# Patient Record
Sex: Male | Born: 1955 | Race: White | Hispanic: No | Smoking: Former smoker
Health system: Southern US, Community
[De-identification: ages and names within clinical notes are randomized; demographics above are authoritative.]

## PROBLEM LIST (undated history)

## (undated) DIAGNOSIS — E785 Hyperlipidemia, unspecified: Secondary | ICD-10-CM

## (undated) DIAGNOSIS — I10 Essential (primary) hypertension: Secondary | ICD-10-CM

## (undated) HISTORY — DX: Essential (primary) hypertension: I10

## (undated) HISTORY — DX: Hyperlipidemia, unspecified: E78.5

## (undated) HISTORY — PX: TONSILLECTOMY: SUR1361

## (undated) HISTORY — PX: CATARACT EXTRACTION: SUR2

## (undated) HISTORY — PX: HYDROCELE EXCISION / REPAIR: SUR1145

---

## 2010-04-30 ENCOUNTER — Inpatient Hospital Stay (INDEPENDENT_AMBULATORY_CARE_PROVIDER_SITE_OTHER)
Admission: RE | Admit: 2010-04-30 | Discharge: 2010-04-30 | Disposition: A | Discharge status: Home / Self Care | Payer: Self-pay | Source: Ambulatory Visit | Attending: Family Medicine | Admitting: Family Medicine

## 2010-04-30 DIAGNOSIS — D1779 Benign lipomatous neoplasm of other sites: Secondary | ICD-10-CM

## 2010-06-16 ENCOUNTER — Encounter (INDEPENDENT_AMBULATORY_CARE_PROVIDER_SITE_OTHER): Payer: Self-pay | Admitting: *Deleted

## 2010-06-16 LAB — CONVERTED CEMR LAB
CO2: 28 meq/L (ref 19–32)
Cholesterol: 263 mg/dL — ABNORMAL HIGH (ref 0–200)
Creatinine, Ser: 1.05 mg/dL (ref 0.40–1.50)
Glucose, Bld: 112 mg/dL — ABNORMAL HIGH (ref 70–99)
HDL: 41 mg/dL (ref >39)
Total Bilirubin: 0.5 mg/dL (ref 0.3–1.2)
Total CHOL/HDL Ratio: 6.4
Triglycerides: 158 mg/dL — ABNORMAL HIGH (ref <150)
VLDL: 32 mg/dL (ref 0–40)

## 2010-07-01 ENCOUNTER — Ambulatory Visit (HOSPITAL_COMMUNITY)
Admission: RE | Admit: 2010-07-01 | Discharge: 2010-07-01 | Disposition: A | Discharge status: Home / Self Care | Payer: Self-pay | Source: Ambulatory Visit | Attending: Surgery | Admitting: Surgery

## 2010-07-01 ENCOUNTER — Other Ambulatory Visit (HOSPITAL_COMMUNITY): Payer: Self-pay | Admitting: Surgery

## 2010-07-01 ENCOUNTER — Encounter (HOSPITAL_COMMUNITY)
Admission: RE | Admit: 2010-07-01 | Discharge: 2010-07-01 | Disposition: A | Discharge status: Home / Self Care | Payer: Self-pay | Source: Ambulatory Visit | Attending: Surgery | Admitting: Surgery

## 2010-07-01 DIAGNOSIS — Z01810 Encounter for preprocedural cardiovascular examination: Secondary | ICD-10-CM | POA: Insufficient documentation

## 2010-07-01 DIAGNOSIS — D1779 Benign lipomatous neoplasm of other sites: Secondary | ICD-10-CM | POA: Insufficient documentation

## 2010-07-01 DIAGNOSIS — Z01811 Encounter for preprocedural respiratory examination: Secondary | ICD-10-CM | POA: Insufficient documentation

## 2010-07-01 DIAGNOSIS — Z01818 Encounter for other preprocedural examination: Secondary | ICD-10-CM | POA: Insufficient documentation

## 2010-07-01 DIAGNOSIS — D170 Benign lipomatous neoplasm of skin and subcutaneous tissue of head, face and neck: Secondary | ICD-10-CM

## 2010-07-01 LAB — CBC
HCT: 46.1 % (ref 39.0–52.0)
MCHC: 35.1 g/dL (ref 30.0–36.0)
RDW: 12.6 % (ref 11.5–15.5)

## 2010-07-01 LAB — SURGICAL PCR SCREEN: MRSA, PCR: NEGATIVE

## 2010-07-01 LAB — BASIC METABOLIC PANEL
BUN: 16 mg/dL (ref 6–23)
Calcium: 9.7 mg/dL (ref 8.4–10.5)
GFR calc non Af Amer: >60 mL/min (ref >60)
Glucose, Bld: 110 mg/dL — ABNORMAL HIGH (ref 70–99)

## 2010-07-10 ENCOUNTER — Ambulatory Visit (HOSPITAL_COMMUNITY)
Admission: RE | Admit: 2010-07-10 | Discharge: 2010-07-10 | Disposition: A | Discharge status: Home / Self Care | Payer: Self-pay | Source: Ambulatory Visit | Attending: Surgery | Admitting: Surgery

## 2010-07-10 ENCOUNTER — Other Ambulatory Visit: Payer: Self-pay | Admitting: Surgery

## 2010-07-10 DIAGNOSIS — L723 Sebaceous cyst: Secondary | ICD-10-CM | POA: Insufficient documentation

## 2010-07-10 DIAGNOSIS — F17200 Nicotine dependence, unspecified, uncomplicated: Secondary | ICD-10-CM | POA: Insufficient documentation

## 2010-07-10 DIAGNOSIS — D1739 Benign lipomatous neoplasm of skin and subcutaneous tissue of other sites: Secondary | ICD-10-CM | POA: Insufficient documentation

## 2010-07-10 DIAGNOSIS — I10 Essential (primary) hypertension: Secondary | ICD-10-CM | POA: Insufficient documentation

## 2010-07-11 NOTE — Op Note (Signed)
  NAME:  Andrew [name], Andrew [name] NO.:  [id number]  MEDICAL RECORD NO.:  [id number]           PATIENT TYPE:  O  LOCATION:  XRAY                         FACILITY:  [facility]  PHYSICIAN:  Sandria Bales. Ezzard Standing, M.D.  DATE OF BIRTH:  1955-07-05  DATE OF PROCEDURE:  07/01/2010                              OPERATIVE REPORT   PREOPERATIVE DIAGNOSIS:  Mass posterior neck 4 x 7 cm.  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  PROCEDURES:  Excision of lipoma and excision of sebaceous cyst.  SURGEON:  Sandria Bales. Ezzard Standing, MD.  ASSISTANT:  None.  ANESTHESIA:  General endotracheal in the prone position with 18 mL of 0.25% Marcaine.  COMPLICATIONS:  None.  PROCEDURE:  [name] is a 55 year old white male, who has no primary medical doctor, and who has had a mass on his posterior neck which he has noticed for at least several months if not longer.  It has steadily gotten larger and is causing discomfort and he now comes for excision of this mass.  The patient has no primary care physician.  The risks of surgery include bleeding, infection, nerve injury, and recurrence of the mass which appears to be lipoma.  OPERATIVE NOTE: The patient was placed in a prone position.  His neck was prepped with ChloraPrep, sterilely draped.    A time-out was held and surgical checklist run.  He has a mass which is 4 x 7 cm at the base of his hairline in his posterior neck.  I made a linear incision through the skin, excised this lipoma. The mass was sent to Pathology.  Hemostasis controlled with Bovie electrocautery.  The mass came out as a multilobulated block, but it was not septated.  To the left of the lipoma was a 1-cm sebaceous cyst.   I went excised this cyst through a separate incision.  I irrigated the wounds with 200 mL of saline.  I then closed the subcutaneous tissues with 3-0 Vicryl suture, the skin with a 5-0  Vicryl suture.  I did place two 2-0 nylon sutures in the wound that will have to come out just in case he had some swelling or tension of the suture line at the back of his neck.  The patient tolerated the procedure well.  He was transported to recovery room in good condition.  We discharged home today.  Return to see me in 10-14 days.   Sandria Bales. Ezzard Standing, M.D., FACS   DHN/MEDQ  D:  07/10/2010  T:  07/10/2010  Job:  [id number]  Electronically Signed by Ovidio Kin M.D. on 07/11/2010 11:57:00 AM

## 2016-07-22 ENCOUNTER — Emergency Department (HOSPITAL_COMMUNITY)
Admission: EM | Admit: 2016-07-22 | Discharge: 2016-07-22 | Disposition: A | Discharge status: Home / Self Care | Payer: Worker's Compensation | Attending: Emergency Medicine | Admitting: Emergency Medicine

## 2016-07-22 ENCOUNTER — Emergency Department (HOSPITAL_COMMUNITY): Payer: Worker's Compensation

## 2016-07-22 ENCOUNTER — Encounter (HOSPITAL_COMMUNITY): Payer: Self-pay | Admitting: *Deleted

## 2016-07-22 DIAGNOSIS — S61211A Laceration without foreign body of left index finger without damage to nail, initial encounter: Secondary | ICD-10-CM | POA: Insufficient documentation

## 2016-07-22 DIAGNOSIS — W268XXA Contact with other sharp object(s), not elsewhere classified, initial encounter: Secondary | ICD-10-CM | POA: Insufficient documentation

## 2016-07-22 DIAGNOSIS — Z23 Encounter for immunization: Secondary | ICD-10-CM | POA: Diagnosis not present

## 2016-07-22 DIAGNOSIS — F17200 Nicotine dependence, unspecified, uncomplicated: Secondary | ICD-10-CM | POA: Insufficient documentation

## 2016-07-22 DIAGNOSIS — Y929 Unspecified place or not applicable: Secondary | ICD-10-CM | POA: Diagnosis not present

## 2016-07-22 DIAGNOSIS — Y999 Unspecified external cause status: Secondary | ICD-10-CM | POA: Diagnosis not present

## 2016-07-22 DIAGNOSIS — Y939 Activity, unspecified: Secondary | ICD-10-CM | POA: Diagnosis not present

## 2016-07-22 MED ORDER — CEPHALEXIN 500 MG PO CAPS: 500.0000 mg | ORAL_CAPSULE | Freq: Four times a day (QID) | ORAL | 0 refills | Status: DC

## 2016-07-22 MED ORDER — LIDOCAINE HCL (PF) 1 % IJ SOLN
30.0000 mL | Freq: Once | INTRAMUSCULAR | Status: AC
  Administered 2016-07-22: 10 mL via INTRADERMAL

## 2016-07-22 MED ORDER — TETANUS-DIPHTH-ACELL PERTUSSIS 5-2.5-18.5 LF-MCG/0.5 IM SUSP
0.5000 mL | Freq: Once | INTRAMUSCULAR | Status: AC
  Administered 2016-07-22: 0.5 mL via INTRAMUSCULAR
  Filled 2016-07-22: qty 0.5

## 2016-07-22 MED ORDER — LIDOCAINE HCL (PF) 1 % IJ SOLN
INTRAMUSCULAR | Status: AC
  Administered 2016-07-22: 11:00:00
  Filled 2016-07-22: qty 10

## 2016-07-22 NOTE — ED Notes (Signed)
Patient transported to X-ray 

## 2016-07-22 NOTE — Discharge Instructions (Signed)
You have 11 stitches in your finger. Make sure you apply the antibiotic ointment. Take antibiotics as prescribed. Wound recheck in 3-4 days. Need to have the stitches out in 7-10 days. Keep the splint applied to decrease the mobility so you don't pull the stiches out. Return to the ED sooner if he develops any signs of infection. Make sure you have close follow-up.

## 2016-07-22 NOTE — ED Triage Notes (Signed)
Pt was grinding on a drum and the blade flew apart causing injury to left index finger- ( 1.5 in lac, bleeding controlled.  Able to move finger.  Avulsion to the left middle finger,  Last tetanus unknown.

## 2016-07-22 NOTE — ED Notes (Signed)
Called ortho tech for static finger splint.

## 2016-07-22 NOTE — ED Notes (Signed)
Pt needs UDS for WC. Lab notified.

## 2016-07-22 NOTE — ED Notes (Signed)
To x-ray

## 2016-07-22 NOTE — ED Provider Notes (Signed)
[facility] DEPT Provider Note   CSN: [id number] Arrival date & time: 07/22/16  6269  By signing my name below, I, Levester Fresh, attest that this documentation has been prepared under the direction and in the presence of Doristine Devoid, PA-C.  Electronically Signed: Levester Fresh, Scribe. 07/22/2016. 11:36 AM.  History   Chief Complaint Chief Complaint  Patient presents with  . Finger Injury   Jakari Sada Boyko is a 61 y.o. male who presents to the ED with complaints of left finger pain s/p a laceration injury. Pt was grinding on a drum when the blade became dislodged. Left 2nd digit with 1.5 inch lac and 3rd digit with abrasion. Bleeding controlled. Pt can bend finger and reports that sensation is intact. Unknown last tetanus.  Pt denies experiencing any other acute sx, including fever, nausea, vomiting, chills, numbness or weakness.   The history is provided by the patient. No language interpreter was used.    History reviewed. No pertinent past medical history.  There are no active problems to display for this patient.   History reviewed. No pertinent surgical history.     Home Medications    Prior to Admission medications   Not on File    Family History No family history on file.  Social History Social History  Substance Use Topics  . Smoking status: Current Some Day Smoker  . Smokeless tobacco: Never Used  . Alcohol use Yes     Comment: occ     Allergies   Patient has no known allergies.   Review of Systems Review of Systems  Constitutional: Negative for fever.  Gastrointestinal: Negative for nausea and vomiting.  Musculoskeletal:       Finger pain  Skin: Positive for wound.  Neurological: Negative for weakness and numbness.     Physical Exam Updated Vital Signs BP (!) 161/93 (BP Location: Right Arm)   Pulse 68   Temp 97.5 F (36.4 C) (Oral)   Resp 16   SpO2 95%   Physical Exam  Constitutional: He is oriented to person,  place, and time. He appears well-developed and well-nourished. No distress.  HENT:  Head: Normocephalic and atraumatic.  Cardiovascular: Normal rate and intact distal pulses.   Pulmonary/Chest: Effort normal.  Musculoskeletal: Normal range of motion.  1.5 in laceration to left index finger with skin flap. Wound was explored with no joint capsule involvement. Abrasion to middle finger. Bleeding controlled. Nailbed intact on index and middle finger. Sensation intact beyond injury to sharp/dull. Full range of motion of the DIP and PIP along with MC joint of the index and middle finger of the left hand. Cap refill is normal. Strength the DIP and PIP is 5 out of 5.  Neurological: He is alert and oriented to person, place, and time.  Skin: Skin is warm and dry. Capillary refill takes less than 2 seconds.  Psychiatric: He has a normal mood and affect.  Nursing note and vitals reviewed.       ED Treatments / Results  DIAGNOSTIC STUDIES: Oxygen Saturation is 95% on RA, adequate by my interpretation.    COORDINATION OF CARE: 10:22 AM Discussed treatment plan with pt at bedside and pt agreed to plan. Will consult hand.   Labs (all labs ordered are listed, but only abnormal results are displayed) Labs Reviewed - No data to display  EKG  EKG Interpretation None       Radiology Dg Finger Index Left  Result Date: 07/22/2016 CLINICAL DATA:  Left  index finger injury.  Laceration. EXAM: LEFT INDEX FINGER 2+V COMPARISON:  None. FINDINGS: No acute bony abnormality. Specifically, no fracture, subluxation, or dislocation. Soft tissues are intact. No radiopaque foreign bodies. IMPRESSION: No acute bony abnormality. Electronically Signed   By: Rolm Baptise M.D.   On: 07/22/2016 10:19    Procedures .Marland KitchenLaceration Repair Date/Time: 07/22/2016 11:37 AM Performed by: Doristine Devoid Authorized by: Ocie Cornfield T   Consent:    Consent obtained:  Verbal   Consent given by:  Patient   Risks  discussed:  Infection, pain, retained foreign body, tendon damage, poor cosmetic result, need for additional repair, nerve damage, poor wound healing and vascular damage   Alternatives discussed:  No treatment Anesthesia (see MAR for exact dosages):    Anesthesia method:  Nerve block   Block needle gauge:  27 G   Block anesthetic:  Lidocaine 1% w/o epi   Block injection procedure:  Anatomic landmarks identified, anatomic landmarks palpated, introduced needle, negative aspiration for blood and incremental injection   Block outcome:  Anesthesia achieved Laceration details:    Location:  Finger   Finger location:  L index finger   Length (cm):  1.5   Depth (mm):  5 Repair type:    Repair type:  Simple Pre-procedure details:    Preparation:  Patient was prepped and draped in usual sterile fashion and imaging obtained to evaluate for foreign bodies (no bony involvment or foreign body) Exploration:    Hemostasis achieved with:  Direct pressure   Wound exploration: wound explored through full range of motion and entire depth of wound probed and visualized     Wound extent: no foreign bodies/material noted, no muscle damage noted, no nerve damage noted, no tendon damage noted, no underlying fracture noted and no vascular damage noted     Contaminated: no   Treatment:    Area cleansed with:  Betadine and saline   Amount of cleaning:  Standard   Irrigation solution:  Sterile saline   Irrigation volume:  150   Irrigation method:  Pressure wash   Visualized foreign bodies/material removed: no   Skin repair:    Repair method:  Sutures   Suture size:  4-0   Suture material:  Prolene   Suture technique:  Simple interrupted   Number of sutures:  11 Approximation:    Approximation:  Close   Vermilion border: well-aligned   Post-procedure details:    Dressing:  Antibiotic ointment, splint for protection and bulky dressing   Patient tolerance of procedure:  Tolerated well, no immediate  complications   .Nerve Block Date/Time: 07/22/2016 5:10 PM Performed by: Ocie Cornfield T Authorized by: Ocie Cornfield T   Consent:    Consent obtained:  Verbal   Consent given by:  Patient   Risks discussed:  Allergic reaction, bleeding, intravenous injection, infection, nerve damage, pain, unsuccessful block and swelling   Alternatives discussed:  No treatment Indications:    Indications:  Procedural anesthesia Location:    Body area:  Upper extremity   Upper extremity nerve blocked: digit block.   Laterality:  Left Pre-procedure details:    Skin preparation:  Povidone-iodine   Preparation: Patient was prepped and draped in usual sterile fashion   Skin anesthesia (see MAR for exact dosages):    Skin anesthesia method:  None Procedure details (see MAR for exact dosages):    Block needle gauge:  27 G   Anesthetic injected:  Lidocaine 1% w/o epi   Paresthesia:  Immediately resolved  Post-procedure details:    Outcome:  Anesthesia achieved   Patient tolerance of procedure:  Tolerated well, no immediate complications   (including critical care time)  Medications Ordered in ED Medications  Tdap (BOOSTRIX) injection 0.5 mL (0.5 mLs Intramuscular Given 07/22/16 1020)  lidocaine (PF) (XYLOCAINE) 1 % injection 30 mL (10 mLs Intradermal Given 07/22/16 1020)  lidocaine (PF) (XYLOCAINE) 1 % injection (  Given 07/22/16 1032)     Initial Impression / Assessment and Plan / ED Course  I have reviewed the triage vital signs and the nursing notes.  Pertinent labs & imaging results that were available during my care of the patient were reviewed by me and considered in my medical decision making (see chart for details).     Tdap booster given.Pressure irrigation performed. Laceration occurred < 8 hours prior to repair which was well tolerated. X-ray showed no bony involvement. Patient is neurovascularly intact. Full range of motion. Strength of the index and middle finger of the left  hand. Patient will be placed on prophylactic antibiotics to cover for infection. We'll paste and splint as the wound is over a joint. Pt has no co morbidities to effect normal wound healing. Discussed suture home care w pt and answered questions. Wound recheck in 3-5 days with primary care doctor or return to the ED. Pt to f-u for wound check and suture removal in 7-10 days. Pt is hemodynamically stable w no complaints prior to dc.  Patient was seen and examined by Dr. Venora Maples who is agreeable to above plan.   Final Clinical Impressions(s) / ED Diagnoses   Final diagnoses:  Laceration of left index finger without foreign body without damage to nail, initial encounter    New Prescriptions Discharge Medication List as of 07/22/2016 11:58 AM    START taking these medications   Details  cephALEXin (KEFLEX) 500 MG capsule Take 1 capsule (500 mg total) by mouth 4 (four) times daily., Starting Wed 07/22/2016, Print       I personally performed the services described in this documentation, which was scribed in my presence. The recorded information has been reviewed and is accurate.    Doristine Devoid, PA-C 07/22/16 [name], MD 07/22/16 [phone]

## 2016-07-22 NOTE — ED Notes (Signed)
PA at bedside suturing.

## 2016-07-22 NOTE — Progress Notes (Signed)
Orthopedic Tech Progress Note Patient Details:  Andrew [name] 1955-05-19 [id number]  Ortho Devices Type of Ortho Device: Finger splint Ortho Device/Splint Interventions: Application   [name] 07/22/2016, 11:15 AM

## 2016-07-22 NOTE — ED Notes (Signed)
Used 2 vials of 1% Lidocaine.

## 2016-07-22 NOTE — ED Notes (Signed)
Ortho notified

## 2016-07-22 NOTE — ED Notes (Signed)
Dressed the abrasion on the pt's middle finger.

## 2016-07-26 ENCOUNTER — Emergency Department (HOSPITAL_COMMUNITY)
Admission: EM | Admit: 2016-07-26 | Discharge: 2016-07-26 | Disposition: A | Discharge status: Home / Self Care | Payer: Worker's Compensation | Attending: Emergency Medicine | Admitting: Emergency Medicine

## 2016-07-26 ENCOUNTER — Encounter (HOSPITAL_COMMUNITY): Payer: Self-pay | Admitting: Emergency Medicine

## 2016-07-26 DIAGNOSIS — Z4801 Encounter for change or removal of surgical wound dressing: Secondary | ICD-10-CM | POA: Insufficient documentation

## 2016-07-26 DIAGNOSIS — F17200 Nicotine dependence, unspecified, uncomplicated: Secondary | ICD-10-CM | POA: Insufficient documentation

## 2016-07-26 DIAGNOSIS — Z5189 Encounter for other specified aftercare: Secondary | ICD-10-CM

## 2016-07-26 DIAGNOSIS — Z79899 Other long term (current) drug therapy: Secondary | ICD-10-CM | POA: Insufficient documentation

## 2016-07-26 NOTE — Discharge Instructions (Signed)
Return here for fever, wound drainage, redness to the finger.

## 2016-07-26 NOTE — ED Triage Notes (Addendum)
Pt in for wound/stitches check. Pt had L finger lac with stitches placed 4 days ago. Stitches well approximated, pt denies any numbness/discoloration to finger.

## 2016-07-26 NOTE — ED Provider Notes (Signed)
  [facility] DEPT Provider Note   CSN: [id number] Arrival date & time: 07/26/16  0848     History   Chief Complaint Chief Complaint  Patient presents with  . Wound Check    HPI Andrew [name] is a 61 y.o. male.  61 year old man here who presents for wound check to his left index finger. Has has a displaced 5 days ago. Denies any fever, drainage, redness to the finger. Has no other complaints at this time.      History reviewed. No pertinent past medical history.  There are no active problems to display for this patient.   History reviewed. No pertinent surgical history.     Home Medications    Prior to Admission medications   Medication Sig Start Date End Date Taking? Authorizing Provider  cephALEXin (KEFLEX) 500 MG capsule Take 1 capsule (500 mg total) by mouth 4 (four) times daily. 07/22/16   Doristine Devoid, PA-C    Family History No family history on file.  Social History Social History  Substance Use Topics  . Smoking status: Current Some Day Smoker  . Smokeless tobacco: Never Used  . Alcohol use Yes     Comment: occ     Allergies   Patient has no known allergies.   Review of Systems Review of Systems  All other systems reviewed and are negative.    Physical Exam Updated Vital Signs BP (!) 216/90 (BP Location: Right Arm) Comment: Simultaneous filing. User may not have seen previous data.  Pulse 80 Comment: Simultaneous filing. User may not have seen previous data.  Temp 97.7 F (36.5 C) (Oral)   Resp 20   Wt 80.3 kg   SpO2 98% Comment: Simultaneous filing. User may not have seen previous data.  Physical Exam  Constitutional: He is oriented to person, place, and time. He appears well-developed and well-nourished.  Non-toxic appearance.  HENT:  Head: Normocephalic and atraumatic.  Eyes: Conjunctivae are normal. Pupils are equal, round, and reactive to light.  Neck: Normal range of motion.  Cardiovascular: Normal rate.     Pulmonary/Chest: Effort normal.  Musculoskeletal:       Hands: Neurological: He is alert and oriented to person, place, and time.  Skin: Skin is warm and dry.  Psychiatric: He has a normal mood and affect.  Nursing note and vitals reviewed.    ED Treatments / Results  Labs (all labs ordered are listed, but only abnormal results are displayed) Labs Reviewed - No data to display  EKG  EKG Interpretation None       Radiology No results found.  Procedures Procedures (including critical care time)  Medications Ordered in ED Medications - No data to display   Initial Impression / Assessment and Plan / ED Course  I have reviewed the triage vital signs and the nursing notes.  Pertinent labs & imaging results that were available during my care of the patient were reviewed by me and considered in my medical decision making (see chart for details).     Patient's wound is healing well and he will return in 2-3 days for suture removal. Return precautions given  Final Clinical Impressions(s) / ED Diagnoses   Final diagnoses:  Visit for wound check    New Prescriptions New Prescriptions   No medications on file     Lacretia Leigh, MD 07/26/16 [phone]

## 2016-08-01 ENCOUNTER — Emergency Department (HOSPITAL_COMMUNITY)
Admission: EM | Admit: 2016-08-01 | Discharge: 2016-08-01 | Disposition: A | Discharge status: Home / Self Care | Payer: Worker's Compensation | Attending: Emergency Medicine | Admitting: Emergency Medicine

## 2016-08-01 ENCOUNTER — Encounter (HOSPITAL_COMMUNITY): Payer: Self-pay | Admitting: Emergency Medicine

## 2016-08-01 DIAGNOSIS — F17200 Nicotine dependence, unspecified, uncomplicated: Secondary | ICD-10-CM | POA: Diagnosis not present

## 2016-08-01 DIAGNOSIS — Z4802 Encounter for removal of sutures: Secondary | ICD-10-CM | POA: Diagnosis present

## 2016-08-01 NOTE — Discharge Instructions (Signed)
Return for worsening symptoms, including fever, increased redness/swelling of finger, pus drainage, escalating pain or any other symptoms concerning to you.

## 2016-08-01 NOTE — ED Provider Notes (Signed)
[facility] DEPT Provider Note   CSN: [id number] Arrival date & time: 08/01/16  0620     History   Chief Complaint Chief Complaint  Patient presents with  . Suture / Staple Removal    HPI Andrew [name] is a 61 y.o. male.  HPI Presents for suture removal. Seen in ED 10 days ago with laceration to the left index finger. Now due to suture removal. No fever or chills. No pain, increased redness or swelling, or purulent drainage.   History reviewed. No pertinent past medical history.  There are no active problems to display for this patient.   History reviewed. No pertinent surgical history.     Home Medications    Prior to Admission medications   Medication Sig Start Date End Date Taking? Authorizing Provider  cephALEXin (KEFLEX) 500 MG capsule Take 1 capsule (500 mg total) by mouth 4 (four) times daily. 07/22/16   Doristine Devoid, PA-C    Family History No family history on file.  Social History Social History  Substance Use Topics  . Smoking status: Current Some Day Smoker  . Smokeless tobacco: Never Used  . Alcohol use Yes     Comment: occ     Allergies   Patient has no known allergies.   Review of Systems Review of Systems  Constitutional: Negative for fever.  Gastrointestinal: Negative for nausea and vomiting.  Skin: Positive for wound.  Psychiatric/Behavioral: Negative for confusion.  All other systems reviewed and are negative.    Physical Exam Updated Vital Signs BP (!) 178/84 (BP Location: Right Arm)   Pulse 73   Temp 97.7 F (36.5 C) (Oral)   Resp 16   Ht 6' (1.829 m)   Wt 190 lb (86.2 kg)   SpO2 96%   BMI 25.77 kg/m   Physical Exam Physical Exam  Constitutional: He appears well-developed and well-nourished.  HENT:  Head: Normocephalic.  Eyes: Conjunctivae are normal.  Cardiovascular: Normal rate and intact distal pulses.   Pulmonary/Chest: Effort normal. No respiratory distress.  Abdominal: He exhibits no  distension.  Musculoskeletal: Normal range of motion of left index finger. He exhibits no deformity.  Neurological: He is alert.  Skin: Skin is warm and dry. laceration to the left index finger well approximated by sutures. No surrounding erythema, warmth,d rainage or tenderness Psychiatric: He has a normal mood and affect. His behavior is normal.  Nursing note and vitals reviewed.   ED Treatments / Results  Labs (all labs ordered are listed, but only abnormal results are displayed) Labs Reviewed - No data to display  EKG  EKG Interpretation None       Radiology No results found.  Procedures .Suture Removal Date/Time: 08/01/2016 7:45 AM Performed by: Brantley Stage DUO Authorized by: Brantley Stage DUO   Consent:    Consent obtained:  Verbal   Consent given by:  Patient   Risks discussed:  Bleeding, pain and wound separation Location:    Location: left index finger. Procedure details:    Wound appearance:  No signs of infection   Number of sutures removed:  9 Post-procedure details:    Post-removal:  No dressing applied   Patient tolerance of procedure:  Tolerated well, no immediate complications   (including critical care time)  Medications Ordered in ED Medications - No data to display   Initial Impression / Assessment and Plan / ED Course  I have reviewed the triage vital signs and the nursing notes.  Pertinent labs & imaging results that  were available during my care of the patient were reviewed by me and considered in my medical decision making (see chart for details).     Records reviewed. 11 sutures reportedly placed, but only 9 visibly removed. No retained sutures identified. Patient states some sutures may have fallen out prior to suture removal. Will watch for signs of infection. Strict return and follow-up instructions reviewed. He expressed understanding of all discharge instructions and felt comfortable with the plan of care.   Final Clinical Impressions(s)  / ED Diagnoses   Final diagnoses:  Visit for suture removal    New Prescriptions New Prescriptions   No medications on file     Forde Dandy, MD 08/01/16 [phone]

## 2016-08-01 NOTE — ED Triage Notes (Signed)
Pt. Here to have sutures removed from left index finger . Wound is clean , no redness

## 2018-08-17 IMAGING — DX DG FINGER INDEX 2+V*L*
3 series · 3 of 3 positions shown · non-contrast
Comparison: None.

CLINICAL DATA: Left index finger injury.  Laceration.

EXAM:
LEFT INDEX FINGER 2+V

[finger ap]
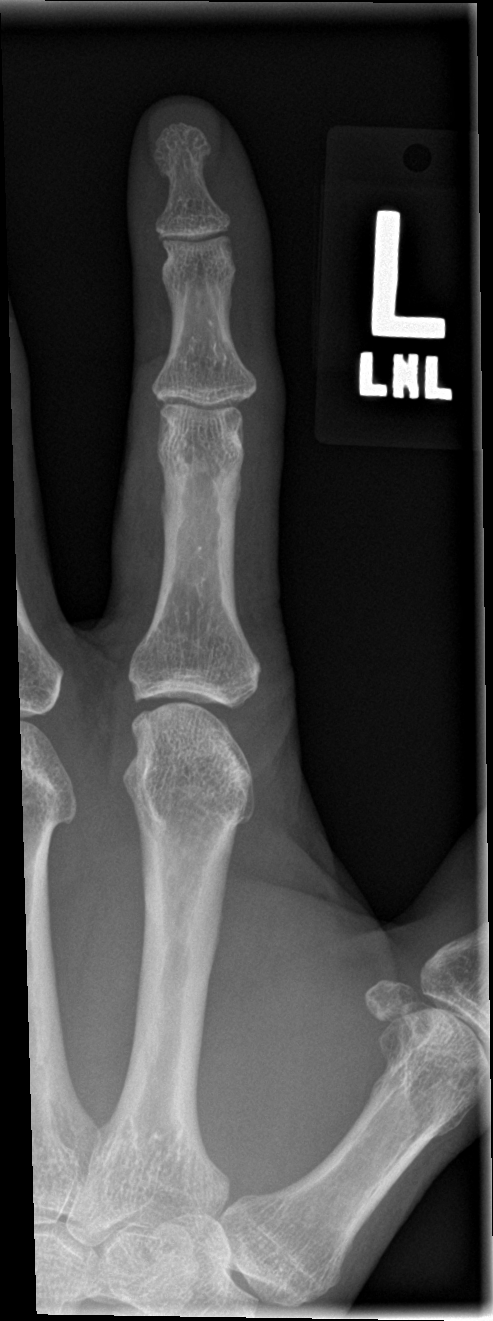

[finger obl]
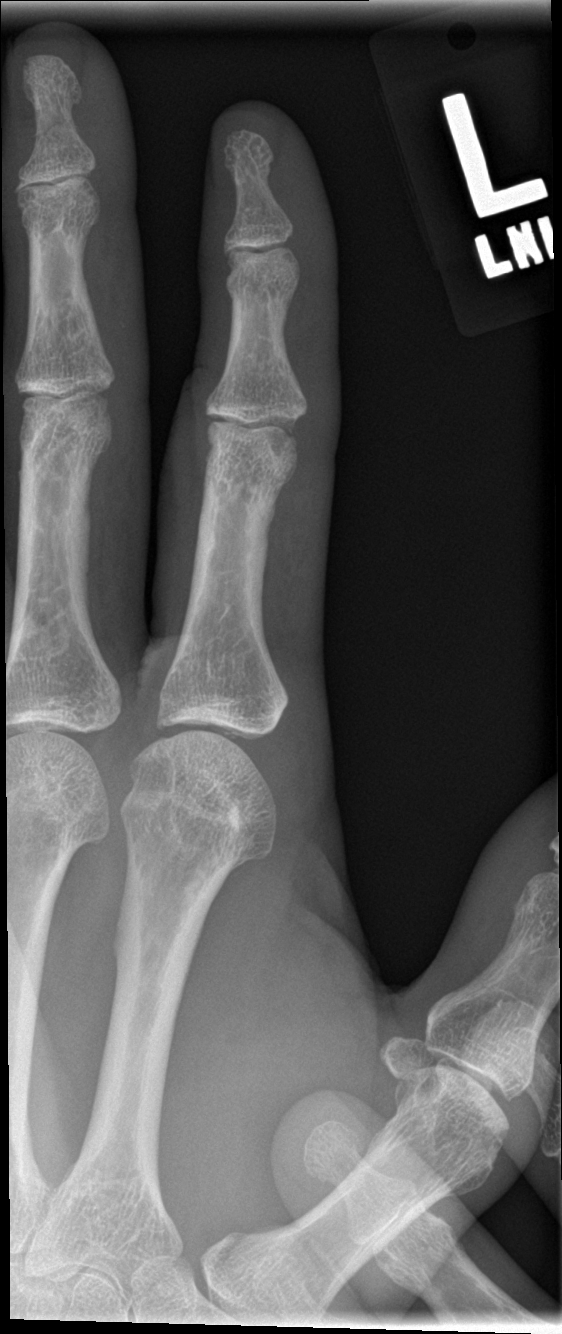

[finger lat]
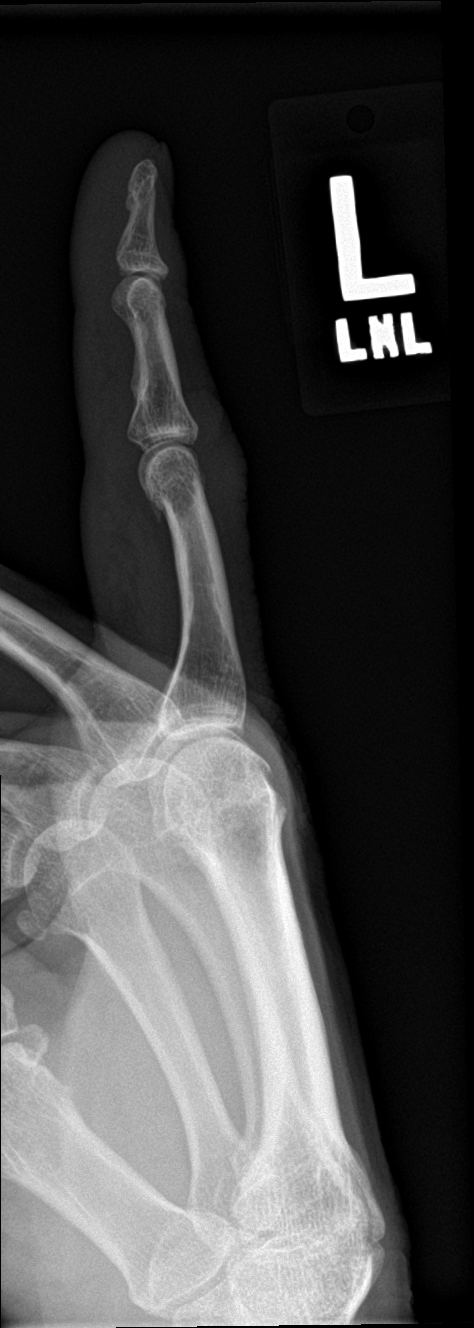

[3 of 3 positions shown; findings below may reference images not displayed]

FINDINGS: No acute bony abnormality. Specifically, no fracture, subluxation,
or dislocation. Soft tissues are intact. No radiopaque foreign
bodies.
IMPRESSION: No acute bony abnormality.

## 2019-09-08 DIAGNOSIS — H25812 Combined forms of age-related cataract, left eye: Secondary | ICD-10-CM | POA: Diagnosis not present

## 2019-09-18 DIAGNOSIS — H2512 Age-related nuclear cataract, left eye: Secondary | ICD-10-CM | POA: Diagnosis not present

## 2019-09-18 DIAGNOSIS — H25812 Combined forms of age-related cataract, left eye: Secondary | ICD-10-CM | POA: Diagnosis not present

## 2019-09-18 DIAGNOSIS — Z01818 Encounter for other preprocedural examination: Secondary | ICD-10-CM | POA: Diagnosis not present

## 2019-10-23 DIAGNOSIS — H25811 Combined forms of age-related cataract, right eye: Secondary | ICD-10-CM | POA: Diagnosis not present

## 2019-10-23 DIAGNOSIS — H2511 Age-related nuclear cataract, right eye: Secondary | ICD-10-CM | POA: Diagnosis not present

## 2019-11-28 DIAGNOSIS — Z1389 Encounter for screening for other disorder: Secondary | ICD-10-CM | POA: Diagnosis not present

## 2019-11-28 DIAGNOSIS — I10 Essential (primary) hypertension: Secondary | ICD-10-CM | POA: Diagnosis not present

## 2019-11-28 DIAGNOSIS — M5136 Other intervertebral disc degeneration, lumbar region: Secondary | ICD-10-CM | POA: Diagnosis not present

## 2020-01-22 DIAGNOSIS — Z23 Encounter for immunization: Secondary | ICD-10-CM | POA: Diagnosis not present

## 2020-01-22 DIAGNOSIS — Z0000 Encounter for general adult medical examination without abnormal findings: Secondary | ICD-10-CM | POA: Diagnosis not present

## 2020-01-22 DIAGNOSIS — M5136 Other intervertebral disc degeneration, lumbar region: Secondary | ICD-10-CM | POA: Diagnosis not present

## 2020-01-22 DIAGNOSIS — Z125 Encounter for screening for malignant neoplasm of prostate: Secondary | ICD-10-CM | POA: Diagnosis not present

## 2020-01-22 DIAGNOSIS — R002 Palpitations: Secondary | ICD-10-CM | POA: Diagnosis not present

## 2020-01-22 DIAGNOSIS — Z136 Encounter for screening for cardiovascular disorders: Secondary | ICD-10-CM | POA: Diagnosis not present

## 2020-01-22 DIAGNOSIS — E7800 Pure hypercholesterolemia, unspecified: Secondary | ICD-10-CM | POA: Diagnosis not present

## 2020-01-22 DIAGNOSIS — Z1389 Encounter for screening for other disorder: Secondary | ICD-10-CM | POA: Diagnosis not present

## 2020-01-22 DIAGNOSIS — I493 Ventricular premature depolarization: Secondary | ICD-10-CM | POA: Diagnosis not present

## 2020-01-22 DIAGNOSIS — I10 Essential (primary) hypertension: Secondary | ICD-10-CM | POA: Diagnosis not present

## 2020-03-26 DIAGNOSIS — E7800 Pure hypercholesterolemia, unspecified: Secondary | ICD-10-CM | POA: Diagnosis not present

## 2020-06-27 DIAGNOSIS — Z1211 Encounter for screening for malignant neoplasm of colon: Secondary | ICD-10-CM | POA: Diagnosis not present

## 2020-06-27 DIAGNOSIS — D123 Benign neoplasm of transverse colon: Secondary | ICD-10-CM | POA: Diagnosis not present

## 2020-06-27 DIAGNOSIS — K635 Polyp of colon: Secondary | ICD-10-CM | POA: Diagnosis not present

## 2020-06-27 DIAGNOSIS — K5730 Diverticulosis of large intestine without perforation or abscess without bleeding: Secondary | ICD-10-CM | POA: Diagnosis not present

## 2020-06-27 DIAGNOSIS — D125 Benign neoplasm of sigmoid colon: Secondary | ICD-10-CM | POA: Diagnosis not present

## 2020-06-27 DIAGNOSIS — D124 Benign neoplasm of descending colon: Secondary | ICD-10-CM | POA: Diagnosis not present

## 2020-06-27 DIAGNOSIS — K648 Other hemorrhoids: Secondary | ICD-10-CM | POA: Diagnosis not present

## 2020-06-27 DIAGNOSIS — D122 Benign neoplasm of ascending colon: Secondary | ICD-10-CM | POA: Diagnosis not present

## 2020-07-02 DIAGNOSIS — K635 Polyp of colon: Secondary | ICD-10-CM | POA: Diagnosis not present

## 2020-07-02 DIAGNOSIS — D125 Benign neoplasm of sigmoid colon: Secondary | ICD-10-CM | POA: Diagnosis not present

## 2020-07-02 DIAGNOSIS — D124 Benign neoplasm of descending colon: Secondary | ICD-10-CM | POA: Diagnosis not present

## 2020-07-02 DIAGNOSIS — D122 Benign neoplasm of ascending colon: Secondary | ICD-10-CM | POA: Diagnosis not present

## 2020-07-02 DIAGNOSIS — D123 Benign neoplasm of transverse colon: Secondary | ICD-10-CM | POA: Diagnosis not present

## 2020-07-30 ENCOUNTER — Other Ambulatory Visit: Payer: Self-pay | Admitting: Internal Medicine

## 2020-07-30 ENCOUNTER — Ambulatory Visit
Admission: RE | Admit: 2020-07-30 | Discharge: 2020-07-30 | Disposition: A | Discharge status: Home / Self Care | Payer: PPO | Source: Ambulatory Visit | Attending: Internal Medicine | Admitting: Internal Medicine

## 2020-07-30 DIAGNOSIS — Z87891 Personal history of nicotine dependence: Secondary | ICD-10-CM

## 2020-07-30 DIAGNOSIS — M5136 Other intervertebral disc degeneration, lumbar region: Secondary | ICD-10-CM | POA: Diagnosis not present

## 2020-07-30 DIAGNOSIS — R0602 Shortness of breath: Secondary | ICD-10-CM | POA: Diagnosis not present

## 2020-07-30 DIAGNOSIS — I10 Essential (primary) hypertension: Secondary | ICD-10-CM | POA: Diagnosis not present

## 2020-07-30 DIAGNOSIS — E7800 Pure hypercholesterolemia, unspecified: Secondary | ICD-10-CM | POA: Diagnosis not present

## 2021-02-18 DIAGNOSIS — Z1389 Encounter for screening for other disorder: Secondary | ICD-10-CM | POA: Diagnosis not present

## 2021-02-18 DIAGNOSIS — R5383 Other fatigue: Secondary | ICD-10-CM | POA: Diagnosis not present

## 2021-02-18 DIAGNOSIS — Z0000 Encounter for general adult medical examination without abnormal findings: Secondary | ICD-10-CM | POA: Diagnosis not present

## 2021-02-18 DIAGNOSIS — I10 Essential (primary) hypertension: Secondary | ICD-10-CM | POA: Diagnosis not present

## 2021-02-18 DIAGNOSIS — E7800 Pure hypercholesterolemia, unspecified: Secondary | ICD-10-CM | POA: Diagnosis not present

## 2021-02-18 DIAGNOSIS — Z23 Encounter for immunization: Secondary | ICD-10-CM | POA: Diagnosis not present

## 2021-02-18 DIAGNOSIS — Z125 Encounter for screening for malignant neoplasm of prostate: Secondary | ICD-10-CM | POA: Diagnosis not present

## 2021-02-18 DIAGNOSIS — R7309 Other abnormal glucose: Secondary | ICD-10-CM | POA: Diagnosis not present

## 2021-03-04 NOTE — Progress Notes (Signed)
Cardiology Office Note:    Date:  03/05/2021   ID:  Andrew [name], DOB [birth year], MRN [id number]  PCP:  Mayra Neer, MD   [place] Providers Cardiologist:  None     Referring MD: Wenda Low, MD   No chief complaint on file. Palpitations  History of Present Illness:    Andrew [name] is a 65 y.o. male with a hx of HTN, HLD referral for heart racing  He feels his heart racing when he is watching TV over the last 6 months. He had one episode with high blood pressure to the 200s. He denies LH or dizziness. No syncope. No SCD. He has no cardiac hx. May have had a stress test in the past. He was a former smoker ~ 30 years ago.  He retired in Jan 2020. 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 He walks his dog  His blood pressure  at home Bps 130-135 SB. He takes amlodipine-benzapril 5-10 mg. His on atorvastatin 10 mg daily. LDL 102 (02/18/2021), HDL 38 mg, Creatinine 0.8, A1c 5.6%. TSH 2.7  He denies angina, dyspnea on exertion, lower extremity edema, PND or orthopnea.   EKG 07/02/2010- Normal sinus rhythm  Past Medical History:  Diagnosis Date   Hyperlipidemia    Hypertension     Past Surgical History:  Procedure Laterality Date   CATARACT EXTRACTION     HYDROCELE EXCISION / REPAIR     TONSILLECTOMY        Current Medications: Current Meds  Medication Sig   amLODipine-benazepril (LOTREL) 5-10 MG capsule 1 tablet   atorvastatin (LIPITOR) 10 MG tablet Take 10 mg by mouth daily.   ibuprofen (ADVIL) 200 MG tablet 2 tablets with food or milk as needed   Multiple Vitamin (MULTI VITAMIN) TABS 1 tablet     Allergies:   Patient has no known allergies.   Social History   Socioeconomic History   Marital status: Divorced    Spouse name: Not on file   Number of children: Not on file   Years of education: Not on file   Highest education level: Not on file  Occupational History   Not on file  Tobacco Use   Smoking status: Some Days   Smokeless tobacco:  Never  Substance and Sexual Activity   Alcohol use: Yes    Comment: occ   Drug use: No   Sexual activity: Not on file  Other Topics Concern   Not on file  Social History Narrative   Not on file   Social Determinants of Health   Financial Resource Strain: Not on file  Food Insecurity: Not on file  Transportation Needs: Not on file  Physical Activity: Not on file  Stress: Not on file  Social Connections: Not on file     Family History: The patient's family history includes Cancer in his father and mother.  ROS:   Please see the history of present illness.     All other systems reviewed and are negative.  EKGs/Labs/Other Studies Reviewed:    The following studies were reviewed today:   EKG:  EKG is  ordered today.  The ekg ordered today demonstrates   03/05/2021-NSR, Qtc 460 ms  Recent Labs: No results found for requested labs within last 8760 hours.  Recent Lipid Panel    Component Value Date/Time   CHOL 263 (H) 06/16/2010 1959   TRIG 158 (H) 06/16/2010 1959   HDL 41 06/16/2010 1959   CHOLHDL 6.4 Ratio 06/16/2010 1959  VLDL 32 06/16/2010 1959   LDLCALC 190 (H) 06/16/2010 1959     Risk Assessment/Calculations:           Physical Exam:    VS:  BP 138/76    Pulse 92    Ht 5\' 11"  (1.803 m)    Wt 233 lb (105.7 kg)    SpO2 97%    BMI 32.50 kg/m     Wt Readings from Last 3 Encounters:  03/05/21 233 lb (105.7 kg)  08/01/16 190 lb (86.2 kg)  07/26/16 177 lb (80.3 kg)     GEN:  Well nourished, well developed in no acute distress HEENT: Normal NECK: No JVD; No carotid bruits LYMPHATICS: No lymphadenopathy CARDIAC: RRR, no murmurs, rubs, gallops RESPIRATORY:  Clear to auscultation without rales, wheezing or rhonchi  ABDOMEN: Soft, non-tender, non-distended MUSCULOSKELETAL:  No edema; No deformity  SKIN: Warm and dry NEUROLOGIC:  Alert and oriented x 3 PSYCHIATRIC:  Normal affect   ASSESSMENT:    #Palpitations: [name] presents with palpitations.  He does not have high risk features including syncope c/f arrhythmia , family hx of SCD, or abnormalities on her EKG.  #CVD Risk: LDL goal < 70 ideally.   #HTN: well controlled, near goal. Continue amlodipine-benzapril 5-10 mg  PLAN:    In order of problems listed above:  7 day ziopatch Follow up PRN           Medication Adjustments/Labs and Tests Ordered: Current medicines are reviewed at length with the patient today.  Concerns regarding medicines are outlined above.  Orders Placed This Encounter  Procedures   EKG 12-Lead   No orders of the defined types were placed in this encounter.   There are no Patient Instructions on file for this visit.   Signed, Janina Mayo, MD  03/05/2021 8:06 AM    [place]

## 2021-03-05 ENCOUNTER — Encounter: Payer: Self-pay | Admitting: Internal Medicine

## 2021-03-05 ENCOUNTER — Ambulatory Visit (INDEPENDENT_AMBULATORY_CARE_PROVIDER_SITE_OTHER): Payer: PPO

## 2021-03-05 ENCOUNTER — Ambulatory Visit (INDEPENDENT_AMBULATORY_CARE_PROVIDER_SITE_OTHER): Payer: PPO | Admitting: Internal Medicine

## 2021-03-05 ENCOUNTER — Other Ambulatory Visit: Payer: Self-pay

## 2021-03-05 VITALS — BP 138/76 | HR 92 | Ht 71.0 in | Wt 233.0 lb

## 2021-03-05 DIAGNOSIS — R002 Palpitations: Secondary | ICD-10-CM | POA: Diagnosis not present

## 2021-03-05 DIAGNOSIS — R5383 Other fatigue: Secondary | ICD-10-CM | POA: Diagnosis not present

## 2021-03-05 NOTE — Patient Instructions (Signed)
Medication Instructions:  No Changes In Medications at this time.  *If you need a refill on your cardiac medications before your next appointment, please call your pharmacy*  Testing/Procedures:  [place] Monitor Instructions   Your physician has requested you wear your ZIO patch monitor 7 days.   This is a single patch monitor.  [employer] supplies one patch monitor per enrollment.  Additional stickers are not available.   Please do not apply patch if you will be having a Nuclear Stress Test, Echocardiogram, Cardiac CT, MRI, or Chest Xray during the time frame you would be wearing the monitor. The patch cannot be worn during these tests.  You cannot remove and re-apply the ZIO XT patch monitor.   Your ZIO patch monitor will be sent USPS Priority mail from [facility] directly to your home address. The monitor may also be mailed to a PO BOX if home delivery is not available.   It may take 3-5 days to receive your monitor after you have been enrolled.   Once you have received you monitor, please review enclosed instructions.  Your monitor has already been registered assigning a specific monitor serial # to you.   Applying the monitor   Shave hair from upper left chest.   Hold abrader disc by orange tab.  Rub abrader in 40 strokes over left upper chest as indicated in your monitor instructions.   Clean area with 4 enclosed alcohol pads .  Use all pads to assure are is cleaned thoroughly.  Let dry.   Apply patch as indicated in monitor instructions.  Patch will be place under collarbone on left side of chest with arrow pointing upward.   Rub patch adhesive wings for 2 minutes.Remove white label marked "1".  Remove white label marked "2".  Rub patch adhesive wings for 2 additional minutes.   While looking in a mirror, press and release button in center of patch.  A small green light will flash 3-4 times .  This will be your only indicator the monitor has been turned on.      Do not shower for the first 24 hours.  You may shower after the first 24 hours.   Press button if you feel a symptom. You will hear a small click.  Record Date, Time and Symptom in the Patient Log Book.   When you are ready to remove patch, follow instructions on last 2 pages of Patient Log Book.  Stick patch monitor onto last page of Patient Log Book.   Place Patient Log Book in [place] box.  Use locking tab on box and tape box closed securely.  The [employer] and [employer] has [employer] on it.  Please place in mailbox as soon as possible.  Your physician should have your test results approximately 7 days after the monitor has been mailed back to [facility].   Call [place] at [phone] if you have questions regarding your ZIO XT patch monitor.  Call them immediately if you see an orange light blinking on your monitor.   If your monitor falls off in less than 4 days contact our Monitor department at [phone].  If your monitor becomes loose or falls off after 4 days call [employer] at [phone] for suggestions on securing your monitor.    Follow-up: At [facility], you and your health needs are our priority.  As part of our continuing mission to provide you with exceptional heart care, we have created designated Provider  Care Teams.  These Care Teams include your primary Cardiologist (physician) and Advanced Practice Providers (APPs -  Physician Assistants and Nurse Practitioners) who all work together to provide you with the care you need, when you need it.  We recommend signing up for the patient portal called "MyChart".  Sign up information is provided on this After Visit Summary.  MyChart is used to connect with patients for Virtual Visits (Telemedicine).  Patients are able to view lab/test results, encounter notes, upcoming appointments, etc.  Non-urgent messages can be sent to your provider as well.   To learn more about what you can do with MyChart, go to  [email or web address].    Your next appointment:   AS NEEDED   The format for your next appointment:   In Person  Provider:   Janina Mayo, MD

## 2021-03-05 NOTE — Progress Notes (Unsigned)
Enrolled patient for a 7 day Zio XT monitor to be mailed to patients home.  

## 2021-03-11 DIAGNOSIS — R002 Palpitations: Secondary | ICD-10-CM

## 2021-03-21 DIAGNOSIS — R002 Palpitations: Secondary | ICD-10-CM | POA: Diagnosis not present

## 2021-08-22 ENCOUNTER — Ambulatory Visit
Admission: RE | Admit: 2021-08-22 | Discharge: 2021-08-22 | Disposition: A | Discharge status: Home / Self Care | Payer: PPO | Source: Ambulatory Visit | Attending: Internal Medicine | Admitting: Internal Medicine

## 2021-08-22 ENCOUNTER — Other Ambulatory Visit: Payer: Self-pay | Admitting: Internal Medicine

## 2021-08-22 DIAGNOSIS — R0609 Other forms of dyspnea: Secondary | ICD-10-CM

## 2021-08-22 DIAGNOSIS — I700 Atherosclerosis of aorta: Secondary | ICD-10-CM | POA: Diagnosis not present

## 2021-08-22 DIAGNOSIS — M5136 Other intervertebral disc degeneration, lumbar region: Secondary | ICD-10-CM | POA: Diagnosis not present

## 2021-08-22 DIAGNOSIS — R059 Cough, unspecified: Secondary | ICD-10-CM | POA: Diagnosis not present

## 2021-08-22 DIAGNOSIS — J432 Centrilobular emphysema: Secondary | ICD-10-CM | POA: Diagnosis not present

## 2021-08-22 DIAGNOSIS — E7800 Pure hypercholesterolemia, unspecified: Secondary | ICD-10-CM | POA: Diagnosis not present

## 2021-08-22 DIAGNOSIS — K449 Diaphragmatic hernia without obstruction or gangrene: Secondary | ICD-10-CM | POA: Diagnosis not present

## 2021-08-22 DIAGNOSIS — I2510 Atherosclerotic heart disease of native coronary artery without angina pectoris: Secondary | ICD-10-CM | POA: Diagnosis not present

## 2021-08-22 DIAGNOSIS — I10 Essential (primary) hypertension: Secondary | ICD-10-CM | POA: Diagnosis not present

## 2021-08-22 MED ORDER — IOPAMIDOL (ISOVUE-300) INJECTION 61%
75.0000 mL | Freq: Once | INTRAVENOUS | Status: AC | PRN
  Administered 2021-08-22: 75 mL via INTRAVENOUS

## 2021-09-05 ENCOUNTER — Encounter: Payer: Self-pay | Admitting: Pulmonary Disease

## 2021-09-05 ENCOUNTER — Ambulatory Visit: Payer: PPO | Admitting: Pulmonary Disease

## 2021-09-05 VITALS — BP 124/78 | HR 104 | Ht 71.0 in | Wt 239.4 lb

## 2021-09-05 DIAGNOSIS — R9389 Abnormal findings on diagnostic imaging of other specified body structures: Secondary | ICD-10-CM

## 2021-09-05 MED ORDER — ANORO ELLIPTA 62.5-25 MCG/ACT IN AEPB: 1.0000 | INHALATION_SPRAY | Freq: Every day | RESPIRATORY_TRACT | 6 refills | Status: DC

## 2021-09-05 NOTE — Patient Instructions (Addendum)
CT chest without contrast in 6 months  I will see you 6 months from here  Prescription for inhaler will be sent to the pharmacy for you  Call us with significant concerns

## 2021-09-05 NOTE — Progress Notes (Signed)
Andrew [name]    [id number]    May 07, 1955  Primary Care Physician:Shaw, Nathen May, MD  Referring Physician: Wenda Low, MD [address] [place],  [place] [address]  Chief complaint:   Patient being seen for shortness of breath on exertion, abnormal CT scan of the chest  HPI:  Patient with shortness of breath on exertion  Denies a cough, no weight loss  Recently had a CT scan of the chest showing multiple lung nodules, part solid nodule in the right upper lobe, another semisolid nodule in the right upper lobe, calcified nodule left upper lobe Evidence of emphysema  Denies any chest pains or chest discomfort  No shortness of breath at rest  No chronic cough  Quit smoking about 4 years ago  No pertinent occupational history  History of hypertension-well-controlled  Outpatient Encounter Medications as of 09/05/2021  Medication Sig   amLODipine-benazepril (LOTREL) 5-10 MG capsule 1 tablet   atorvastatin (LIPITOR) 10 MG tablet Take 10 mg by mouth daily.   ibuprofen (ADVIL) 200 MG tablet 2 tablets with food or milk as needed   Multiple Vitamin (MULTI VITAMIN) TABS 1 tablet   No facility-administered encounter medications on file as of 09/05/2021.    Allergies as of 09/05/2021   (No Known Allergies)    Past Medical History:  Diagnosis Date   Hyperlipidemia    Hypertension     Past Surgical History:  Procedure Laterality Date   CATARACT EXTRACTION     HYDROCELE EXCISION / REPAIR     TONSILLECTOMY      Family History  Problem Relation Age of Onset   Cancer Mother    Cancer Father     Social History   Socioeconomic History   Marital status: Divorced    Spouse name: Not on file   Number of children: Not on file   Years of education: Not on file   Highest education level: Not on file  Occupational History   Not on file  Tobacco Use   Smoking status: Former    Packs/day: 0.50    Years: 40.00    Total pack years: 20.00     Types: Cigarettes    Quit date: 03/23/2018    Years since quitting: 3.4   Smokeless tobacco: Never  Substance and Sexual Activity   Alcohol use: Yes    Comment: occ   Drug use: No   Sexual activity: Not on file  Other Topics Concern   Not on file  Social History Narrative   Not on file   Social Determinants of Health   Financial Resource Strain: Not on file  Food Insecurity: Not on file  Transportation Needs: Not on file  Physical Activity: Not on file  Stress: Not on file  Social Connections: Not on file  Intimate Partner Violence: Not on file    Review of Systems  Constitutional:  Negative for fatigue.  Respiratory:  Positive for shortness of breath.   Psychiatric/Behavioral:  Negative for sleep disturbance.     Vitals:   09/05/21 1122  BP: 124/78  Pulse: (!) 104  SpO2: 96%     Physical Exam Constitutional:      Appearance: Normal appearance. He is obese.  HENT:     Head: Normocephalic.     Mouth/Throat:     Mouth: Mucous membranes are moist.  Eyes:     Pupils: Pupils are equal, round, and reactive to light.  Cardiovascular:  Rate and Rhythm: Normal rate and regular rhythm.     Heart sounds: No murmur heard.    No friction rub.  Pulmonary:     Effort: No respiratory distress.     Breath sounds: No stridor. No rhonchi.  Musculoskeletal:     Cervical back: No rigidity or tenderness.  Neurological:     Mental Status: He is alert.  Psychiatric:        Mood and Affect: Mood normal.    Data Reviewed: Recent CT chest 08/22/2021 reviewed with the patient showing multiple lung nodules, evidence of emphysema  Assessment:  Chronic obstructive pulmonary disease  Shortness of breath on exertion  Multiple lung nodules  Reformed smoker  Plan/Recommendations: Schedule for CT scan of the chest in 6 months to follow-up on lung nodules  Prescription for Anoro sent to pharmacy  Tentative follow-up in about 6 months  Encouraged to call with significant  concerns   Sherrilyn Rist MD [facility] 09/05/2021, 11:52 AM  CC: Wenda Low, MD

## 2021-09-25 DIAGNOSIS — D125 Benign neoplasm of sigmoid colon: Secondary | ICD-10-CM | POA: Diagnosis not present

## 2021-09-25 DIAGNOSIS — K5730 Diverticulosis of large intestine without perforation or abscess without bleeding: Secondary | ICD-10-CM | POA: Diagnosis not present

## 2021-09-25 DIAGNOSIS — D124 Benign neoplasm of descending colon: Secondary | ICD-10-CM | POA: Diagnosis not present

## 2021-09-25 DIAGNOSIS — Z09 Encounter for follow-up examination after completed treatment for conditions other than malignant neoplasm: Secondary | ICD-10-CM | POA: Diagnosis not present

## 2021-09-25 DIAGNOSIS — K648 Other hemorrhoids: Secondary | ICD-10-CM | POA: Diagnosis not present

## 2021-09-25 DIAGNOSIS — D123 Benign neoplasm of transverse colon: Secondary | ICD-10-CM | POA: Diagnosis not present

## 2021-09-25 DIAGNOSIS — D120 Benign neoplasm of cecum: Secondary | ICD-10-CM | POA: Diagnosis not present

## 2021-09-25 DIAGNOSIS — Z86010 Personal history of colonic polyps: Secondary | ICD-10-CM | POA: Diagnosis not present

## 2021-09-25 DIAGNOSIS — D122 Benign neoplasm of ascending colon: Secondary | ICD-10-CM | POA: Diagnosis not present

## 2021-09-29 DIAGNOSIS — D120 Benign neoplasm of cecum: Secondary | ICD-10-CM | POA: Diagnosis not present

## 2021-09-29 DIAGNOSIS — D122 Benign neoplasm of ascending colon: Secondary | ICD-10-CM | POA: Diagnosis not present

## 2021-09-29 DIAGNOSIS — D123 Benign neoplasm of transverse colon: Secondary | ICD-10-CM | POA: Diagnosis not present

## 2021-09-29 DIAGNOSIS — D125 Benign neoplasm of sigmoid colon: Secondary | ICD-10-CM | POA: Diagnosis not present

## 2022-01-14 DIAGNOSIS — R911 Solitary pulmonary nodule: Secondary | ICD-10-CM | POA: Diagnosis not present

## 2022-01-14 DIAGNOSIS — R0609 Other forms of dyspnea: Secondary | ICD-10-CM | POA: Diagnosis not present

## 2022-01-14 DIAGNOSIS — Z23 Encounter for immunization: Secondary | ICD-10-CM | POA: Diagnosis not present

## 2022-01-14 DIAGNOSIS — J439 Emphysema, unspecified: Secondary | ICD-10-CM | POA: Diagnosis not present

## 2022-02-18 ENCOUNTER — Ambulatory Visit
Admission: RE | Admit: 2022-02-18 | Discharge: 2022-02-18 | Disposition: A | Discharge status: Home / Self Care | Payer: PPO | Source: Ambulatory Visit | Attending: Pulmonary Disease | Admitting: Pulmonary Disease

## 2022-02-18 DIAGNOSIS — R911 Solitary pulmonary nodule: Secondary | ICD-10-CM | POA: Diagnosis not present

## 2022-02-18 DIAGNOSIS — R9389 Abnormal findings on diagnostic imaging of other specified body structures: Secondary | ICD-10-CM

## 2022-02-18 DIAGNOSIS — J439 Emphysema, unspecified: Secondary | ICD-10-CM | POA: Diagnosis not present

## 2022-02-18 DIAGNOSIS — R918 Other nonspecific abnormal finding of lung field: Secondary | ICD-10-CM | POA: Diagnosis not present

## 2022-02-18 DIAGNOSIS — I700 Atherosclerosis of aorta: Secondary | ICD-10-CM | POA: Diagnosis not present

## 2022-02-26 ENCOUNTER — Ambulatory Visit (INDEPENDENT_AMBULATORY_CARE_PROVIDER_SITE_OTHER): Payer: PPO | Admitting: Pulmonary Disease

## 2022-02-26 ENCOUNTER — Encounter: Payer: Self-pay | Admitting: Pulmonary Disease

## 2022-02-26 VITALS — BP 144/84 | HR 96 | Temp 98.0°F | Ht 71.0 in | Wt 255.0 lb

## 2022-02-26 DIAGNOSIS — R9389 Abnormal findings on diagnostic imaging of other specified body structures: Secondary | ICD-10-CM | POA: Diagnosis not present

## 2022-02-26 NOTE — Progress Notes (Signed)
Andrew [name]    [id number]    1955-06-11  Primary Care Physician:Shaw, Nathen May, MD  Referring Physician: Mayra Neer, MD [address] [place],  [place] [address]  Chief complaint:   Patient being seen for shortness of breath on exertion, abnormal CT scan of the chest  HPI:  Breathing is about the same  CT scan was repeated showing stable lung nodules, unchanged from 6 months ago Recently had a CT scan of the chest showing multiple lung nodules, part solid nodule in the right upper lobe, another semisolid nodule in the right upper lobe, calcified nodule left upper lobe  Breathing is stable No significant shortness of breath with normal activity  Denies any chronic cough  Quit smoking 4 years ago  No pertinent occupational history  History of hypertension-well-controlled  Outpatient Encounter Medications as of 02/26/2022  Medication Sig   albuterol (VENTOLIN HFA) 108 (90 Base) MCG/ACT inhaler SMARTSIG:2 Puff(s) By Mouth Every 4 Hours PRN   amLODipine-benazepril (LOTREL) 5-10 MG capsule 1 tablet   atorvastatin (LIPITOR) 10 MG tablet Take 10 mg by mouth daily.   BREZTRI AEROSPHERE 160-9-4.8 MCG/ACT AERO SMARTSIG:2 Puff(s) By Mouth Twice Daily   ibuprofen (ADVIL) 200 MG tablet 2 tablets with food or milk as needed   Multiple Vitamin (MULTI VITAMIN) TABS 1 tablet   [DISCONTINUED] umeclidinium-vilanterol (ANORO ELLIPTA) 62.5-25 MCG/ACT AEPB Inhale 1 puff into the lungs daily.   No facility-administered encounter medications on file as of 02/26/2022.    Allergies as of 02/26/2022   (No Known Allergies)    Past Medical History:  Diagnosis Date   Hyperlipidemia    Hypertension     Past Surgical History:  Procedure Laterality Date   CATARACT EXTRACTION     HYDROCELE EXCISION / REPAIR     TONSILLECTOMY      Family History  Problem Relation Age of Onset   Cancer Mother    Cancer Father     Social History   Socioeconomic History    Marital status: Divorced    Spouse name: Not on file   Number of children: Not on file   Years of education: Not on file   Highest education level: Not on file  Occupational History   Not on file  Tobacco Use   Smoking status: Former    Packs/day: 0.50    Years: 40.00    Total pack years: 20.00    Types: Cigarettes    Quit date: 03/23/2018    Years since quitting: 3.9   Smokeless tobacco: Never  Substance and Sexual Activity   Alcohol use: Yes    Comment: occ   Drug use: No   Sexual activity: Not on file  Other Topics Concern   Not on file  Social History Narrative   Not on file   Social Determinants of Health   Financial Resource Strain: Not on file  Food Insecurity: Not on file  Transportation Needs: Not on file  Physical Activity: Not on file  Stress: Not on file  Social Connections: Not on file  Intimate Partner Violence: Not on file    Review of Systems  Constitutional:  Negative for fatigue.  Respiratory:  Positive for shortness of breath.   Psychiatric/Behavioral:  Negative for sleep disturbance.     Vitals:   02/26/22 1006  BP: (!) 144/84  Pulse: 96  Temp: 98 F (36.7 C)  SpO2: 96%     Physical Exam Constitutional:  Appearance: Normal appearance. He is obese.  HENT:     Head: Normocephalic.     Mouth/Throat:     Mouth: Mucous membranes are moist.  Eyes:     Pupils: Pupils are equal, round, and reactive to light.  Cardiovascular:     Rate and Rhythm: Normal rate and regular rhythm.     Heart sounds: No murmur heard.    No friction rub.  Pulmonary:     Effort: No respiratory distress.     Breath sounds: No stridor. No rhonchi.  Musculoskeletal:     Cervical back: No rigidity or tenderness.  Neurological:     Mental Status: He is alert.  Psychiatric:        Mood and Affect: Mood normal.    Data Reviewed: Recent CT chest 08/22/2021 reviewed with the patient showing multiple lung nodules, evidence of emphysema -Repeat CT at 6 months  02/18/2022 reviewed with the patient showing stable nodules  Assessment:  Chronic obstructive pulmonary disease -Stable process at present Shortness of breath on exertion -No significant worsening Multiple lung nodules -Stable from previous  Reformed smoker  Plan/Recommendations: Continue Anoro  Repeat CT in about 6 months  Encouraged to call with significant concerns   Sherrilyn Rist MD [facility] 02/26/2022, 10:26 AM  CC: Mayra Neer, MD

## 2022-02-26 NOTE — Patient Instructions (Signed)
Continue Breztri  Repeat CT scan of the chest in 6 months  Obtain PFT at next visit in 6 months  Continue graded exercise as tolerated

## 2022-02-27 DIAGNOSIS — M5136 Other intervertebral disc degeneration, lumbar region: Secondary | ICD-10-CM | POA: Diagnosis not present

## 2022-02-27 DIAGNOSIS — J439 Emphysema, unspecified: Secondary | ICD-10-CM | POA: Diagnosis not present

## 2022-02-27 DIAGNOSIS — I10 Essential (primary) hypertension: Secondary | ICD-10-CM | POA: Diagnosis not present

## 2022-02-27 DIAGNOSIS — Z125 Encounter for screening for malignant neoplasm of prostate: Secondary | ICD-10-CM | POA: Diagnosis not present

## 2022-02-27 DIAGNOSIS — Z1159 Encounter for screening for other viral diseases: Secondary | ICD-10-CM | POA: Diagnosis not present

## 2022-02-27 DIAGNOSIS — Z1331 Encounter for screening for depression: Secondary | ICD-10-CM | POA: Diagnosis not present

## 2022-02-27 DIAGNOSIS — Z0000 Encounter for general adult medical examination without abnormal findings: Secondary | ICD-10-CM | POA: Diagnosis not present

## 2022-02-27 DIAGNOSIS — I700 Atherosclerosis of aorta: Secondary | ICD-10-CM | POA: Diagnosis not present

## 2022-02-27 DIAGNOSIS — I493 Ventricular premature depolarization: Secondary | ICD-10-CM | POA: Diagnosis not present

## 2022-02-27 DIAGNOSIS — E7800 Pure hypercholesterolemia, unspecified: Secondary | ICD-10-CM | POA: Diagnosis not present

## 2022-02-27 DIAGNOSIS — E669 Obesity, unspecified: Secondary | ICD-10-CM | POA: Diagnosis not present

## 2022-08-26 ENCOUNTER — Ambulatory Visit (HOSPITAL_COMMUNITY)
Admission: RE | Admit: 2022-08-26 | Discharge: 2022-08-26 | Disposition: A | Discharge status: Home / Self Care | Payer: PPO | Source: Ambulatory Visit | Attending: Pulmonary Disease | Admitting: Pulmonary Disease

## 2022-08-26 DIAGNOSIS — R9389 Abnormal findings on diagnostic imaging of other specified body structures: Secondary | ICD-10-CM | POA: Diagnosis not present

## 2022-08-26 DIAGNOSIS — R911 Solitary pulmonary nodule: Secondary | ICD-10-CM | POA: Diagnosis not present

## 2022-08-26 DIAGNOSIS — J449 Chronic obstructive pulmonary disease, unspecified: Secondary | ICD-10-CM | POA: Diagnosis not present

## 2022-08-26 DIAGNOSIS — R918 Other nonspecific abnormal finding of lung field: Secondary | ICD-10-CM | POA: Diagnosis not present

## 2022-08-26 DIAGNOSIS — G4700 Insomnia, unspecified: Secondary | ICD-10-CM | POA: Diagnosis not present

## 2022-08-26 DIAGNOSIS — J439 Emphysema, unspecified: Secondary | ICD-10-CM | POA: Diagnosis not present

## 2022-08-26 DIAGNOSIS — I700 Atherosclerosis of aorta: Secondary | ICD-10-CM | POA: Diagnosis not present

## 2022-08-26 DIAGNOSIS — E7800 Pure hypercholesterolemia, unspecified: Secondary | ICD-10-CM | POA: Diagnosis not present

## 2022-08-26 DIAGNOSIS — I10 Essential (primary) hypertension: Secondary | ICD-10-CM | POA: Diagnosis not present

## 2022-09-15 ENCOUNTER — Encounter: Payer: Self-pay | Admitting: Pulmonary Disease

## 2022-09-20 NOTE — Telephone Encounter (Signed)
New semisolid nodules noted-in left lung.  May be infectious/inflammatory.  Findings not suggestive of cancer but needs to be followed closely  Recommendation is to repeat in 3 months-infectious/inflammatory processes are expected to have improved by that time   Previous nodules that were noted have remained stable.

## 2022-11-17 ENCOUNTER — Ambulatory Visit (INDEPENDENT_AMBULATORY_CARE_PROVIDER_SITE_OTHER): Payer: PPO | Admitting: Pulmonary Disease

## 2022-11-17 ENCOUNTER — Ambulatory Visit: Payer: PPO | Admitting: Pulmonary Disease

## 2022-11-17 ENCOUNTER — Encounter: Payer: Self-pay | Admitting: Pulmonary Disease

## 2022-11-17 VITALS — BP 138/78 | HR 117 | Ht 73.0 in | Wt 248.8 lb

## 2022-11-17 DIAGNOSIS — J441 Chronic obstructive pulmonary disease with (acute) exacerbation: Secondary | ICD-10-CM | POA: Diagnosis not present

## 2022-11-17 DIAGNOSIS — R9389 Abnormal findings on diagnostic imaging of other specified body structures: Secondary | ICD-10-CM

## 2022-11-17 LAB — PULMONARY FUNCTION TEST
DL/VA % pred: 88 %
DL/VA: 3.59 ml/min/mmHg/L
DLCO cor % pred: 76 %
DLCO cor: 20.66 ml/min/mmHg
DLCO unc % pred: 76 %
DLCO unc: 20.66 ml/min/mmHg
FEF 25-75 Post: 1.34 L/sec
FEF 25-75 Pre: 0.85 L/s
FEF2575-%Change-Post: 57 %
FEF2575-%Pred-Post: 51 %
FEF2575-%Pred-Pre: 32 %
FEV1-%Change-Post: 24 %
FEV1-%Pred-Post: 61 %
FEV1-%Pred-Pre: 49 %
FEV1-Post: 2.12 L
FEV1-Pre: 1.7 L
FEV1FVC-%Change-Post: 19 %
FEV1FVC-%Pred-Pre: 67 %
FEV6-%Change-Post: 7 %
FEV6-%Pred-Post: 79 %
FEV6-%Pred-Pre: 73 %
FEV6-Post: 3.48 L
FEV6-Pre: 3.24 L
FEV6FVC-%Change-Post: 0 %
FEV6FVC-%Pred-Post: 103 %
FEV6FVC-%Pred-Pre: 103 %
FVC-%Change-Post: 4 %
FVC-%Pred-Post: 76 %
FVC-%Pred-Pre: 73 %
FVC-Post: 3.55 L
FVC-Pre: 3.39 L
Post FEV1/FVC ratio: 60 %
Post FEV6/FVC ratio: 98 %
Pre FEV1/FVC ratio: 50 %
Pre FEV6/FVC Ratio: 98 %
RV % pred: 122 %
RV: 3.05 L
TLC % pred: 104 %
TLC: 7.56 L

## 2022-11-17 NOTE — Patient Instructions (Signed)
Full PFT performed today. °

## 2022-11-17 NOTE — Patient Instructions (Addendum)
Continue current inhalers  Referral for pulmonary rehab  Continue regular activities as tolerated  We will follow-up with a CT ordered for 3 months  I will see you 3 months from here  Call us with significant concerns

## 2022-11-17 NOTE — Progress Notes (Signed)
Full PFT performed today. °

## 2022-11-17 NOTE — Progress Notes (Signed)
Andrew [name]    [id number]    May 23, 1955  Primary Care Physician:Husain, Jerelyn Scott, MD  Referring Physician: Lupita Raider, MD [address] [place],  [place] [address]  Chief complaint:   Patient being seen for shortness of breath on exertion, abnormal CT scan of the chest His sister was on the phone during the visit  HPI:  Breathing is about the same  CT scan was repeated showing stable lung nodules, unchanged from 6 months ago Recently had a CT scan of the chest showing multiple lung nodules, part solid nodule in the right upper lobe, another semisolid nodule in the right upper lobe, calcified nodule left upper lobe  Had a breathing study done showing severe obstructive disease with significant bronchodilator response Activity level is down compared to last year  Denies any chronic cough  Quit smoking 4 years ago  No pertinent occupational history  History of hypertension-well-controlled  Outpatient Encounter Medications as of 11/17/2022  Medication Sig   albuterol (VENTOLIN HFA) 108 (90 Base) MCG/ACT inhaler SMARTSIG:2 Puff(s) By Mouth Every 4 Hours PRN   amLODipine-benazepril (LOTREL) 5-10 MG capsule 1 tablet   atorvastatin (LIPITOR) 10 MG tablet Take 10 mg by mouth daily.   BREZTRI AEROSPHERE 160-9-4.8 MCG/ACT AERO SMARTSIG:2 Puff(s) By Mouth Twice Daily   ibuprofen (ADVIL) 200 MG tablet 2 tablets with food or milk as needed   Multiple Vitamin (MULTI VITAMIN) TABS 1 tablet   No facility-administered encounter medications on file as of 11/17/2022.    Allergies as of 11/17/2022   (No Known Allergies)    Past Medical History:  Diagnosis Date   Hyperlipidemia    Hypertension     Past Surgical History:  Procedure Laterality Date   CATARACT EXTRACTION     HYDROCELE EXCISION / REPAIR     TONSILLECTOMY      Family History  Problem Relation Age of Onset   Cancer Mother    Cancer Father     Social History   Socioeconomic  History   Marital status: Divorced    Spouse name: Not on file   Number of children: Not on file   Years of education: Not on file   Highest education level: Not on file  Occupational History   Not on file  Tobacco Use   Smoking status: Former    Current packs/day: 0.00    Average packs/day: 0.5 packs/day for 40.0 years (20.0 ttl pk-yrs)    Types: Cigarettes    Start date: 03/23/1978    Quit date: 03/23/2018    Years since quitting: 4.6   Smokeless tobacco: Never  Substance and Sexual Activity   Alcohol use: Yes    Comment: occ   Drug use: No   Sexual activity: Not on file  Other Topics Concern   Not on file  Social History Narrative   Not on file   Social Determinants of Health   Financial Resource Strain: Not on file  Food Insecurity: Not on file  Transportation Needs: Not on file  Physical Activity: Not on file  Stress: Not on file  Social Connections: Not on file  Intimate Partner Violence: Not on file    Review of Systems  Constitutional:  Negative for fatigue.  Respiratory:  Positive for shortness of breath.   Psychiatric/Behavioral:  Negative for sleep disturbance.     Vitals:   11/17/22 1003  BP: 138/78  Pulse: (!) 117  SpO2: 96%  Physical Exam Constitutional:      Appearance: Normal appearance. He is obese.  HENT:     Head: Normocephalic.     Mouth/Throat:     Mouth: Mucous membranes are moist.  Eyes:     Pupils: Pupils are equal, round, and reactive to light.  Cardiovascular:     Rate and Rhythm: Normal rate and regular rhythm.     Heart sounds: No murmur heard.    No friction rub.  Pulmonary:     Effort: No respiratory distress.     Breath sounds: No stridor. No rhonchi.  Musculoskeletal:     Cervical back: No rigidity or tenderness.  Neurological:     Mental Status: He is alert.  Psychiatric:        Mood and Affect: Mood normal.    Data Reviewed: Recent CT chest 08/22/2021 reviewed with the patient showing multiple lung nodules,  evidence of emphysema -Repeat CT did show a new lung nodule for which a repeat was scheduled for 3 months  PFT was reviewed with the patient showing severe obstructive disease with an FEV1 of 49% with significant bronchodilator response  Assessment:  Chronic obstructive pulmonary disease -Stable process at present  Shortness of breath on exertion -No significant worsening  Multiple lung nodules -New nodule noted, previous nodules were stable  Reformed smoker  Plan/Recommendations: Will continue Anoro  Will refer for pulmonary rehab   Graded activities as tolerated  Follow-up on CT scan  Encouraged to call with significant concerns   Virl Diamond MD [facility] 11/17/2022, 10:09 AM  CC: Lupita Raider, MD

## 2022-11-19 ENCOUNTER — Encounter (HOSPITAL_COMMUNITY): Payer: Self-pay

## 2022-11-20 ENCOUNTER — Telehealth (HOSPITAL_COMMUNITY): Payer: Self-pay

## 2022-11-20 NOTE — Telephone Encounter (Signed)
Pt insurance is active and benefits verified through [employer]. Co-pay $15.00, DED $0.00/$0.00 met, out of pocket $3,200.00/$100.22 met, co-insurance 0%. No pre-authorization required. Valerie/[employer], 11/20/22 @ 11:34AM, [id number]

## 2022-11-20 NOTE — Telephone Encounter (Signed)
Pt returned PR phone call and stated he is interested in PR. Pt will come in for orientation on 11/27/22 @ 10:30AM and will attend the 10:15AM class.   Mailed letter

## 2022-11-26 ENCOUNTER — Telehealth (HOSPITAL_COMMUNITY): Payer: Self-pay

## 2022-11-26 NOTE — Telephone Encounter (Signed)
Called to confirm appt. Pt confirmed appt. Instructed pt on proper footwear. Gave directions along with department number.

## 2022-11-27 ENCOUNTER — Encounter (HOSPITAL_COMMUNITY)
Admission: RE | Admit: 2022-11-27 | Discharge: 2022-11-27 | Disposition: A | Discharge status: Home / Self Care | Payer: PPO | Source: Ambulatory Visit | Attending: Pulmonary Disease | Admitting: Pulmonary Disease

## 2022-11-27 ENCOUNTER — Encounter (HOSPITAL_COMMUNITY): Payer: Self-pay

## 2022-11-27 VITALS — BP 138/70 | HR 107 | Resp 20 | Ht 71.0 in | Wt 251.8 lb

## 2022-11-27 DIAGNOSIS — Z87891 Personal history of nicotine dependence: Secondary | ICD-10-CM | POA: Diagnosis not present

## 2022-11-27 DIAGNOSIS — J449 Chronic obstructive pulmonary disease, unspecified: Secondary | ICD-10-CM

## 2022-11-27 DIAGNOSIS — Z79899 Other long term (current) drug therapy: Secondary | ICD-10-CM | POA: Diagnosis not present

## 2022-11-27 NOTE — Progress Notes (Signed)
Andrew [name] 67 y.o. male  Pulmonary Rehab Orientation Note  This patient who was referred to Pulmonary Rehab by Dr. Wynona Neat with the diagnosis of COPD 2 arrived today in Cardiac and Pulmonary Rehab. He arrived ambulatory with normal gait. He does not use oxygen. Color good, skin warm and dry. Patient is oriented to time and place. Patient's medical history, psychosocial health, and medications [name].   Psychosocial assessment reveals patient lives with alone. Andrew [name] is currently retired. Patient hobbies include watching tv and reading, walking his dog, and listening to pod casts.  Patient reports his stress level is low. Areas of stress/anxiety include health. Patient does not exhibit signs of depression. Signs of depression include hopelessness and fatigue. PHQ2/9 score 1/4. Andrew [name] shows good  coping skills with positive outlook on life. Offered emotional support and reassurance. Will continue to monitor and evaluate progress toward psychosocial goal(s) of decreased stress.   Physical assessment reveals heart rate is normal, breath sounds diminished to auscultation, no wheezes, rales, or rhonchi. Grip strength equal, strong. Distal pulses present. Andrew [name] reports he  does take medications as prescribed. Patient states he  follows a regular  diet. The patient has been trying to lose weight through a healthy diet and exercise program. Andrew [name]'s weight will be monitored closely.   Demonstration and practice of PLB using pulse oximeter. Andrew [name] able to return demonstration satisfactorily. Safety and hand hygiene in the exercise area [name] with patient. Andrew [name]. Department expectations discussed with patient and achievable goals were set. The patient shows enthusiasm about attending the program and we look forward to working with Andrew [name] completed a 6 min walk test today and is scheduled to begin exercise on 9/12 at 1015.   1030-1200 Andrew Hart, RN, BSN

## 2022-11-27 NOTE — Progress Notes (Signed)
Andrew [name] 67 y.o. male  Initial Psychosocial Assessment  Pt psychosocial assessment reveals pt lives alone. Pt is currently retired. Pt hobbies include reading, listening to pod casts, watching movies and walking his dog. Pt reports his stress level is low. Areas of stress/anxiety include Health.  Pt does not exhibit signs of depression. Pt shows good  coping skills with positive outlook.Offered emotional support and reassurance. We will continue to monitor and evaluate progress toward psychosocial goal(s).  Goal(s): Improved management of decreased stress Improved coping skills Help patient work toward returning to meaningful activities that improve patient's QOL and are attainable with patient's lung disease   11/27/2022 11:52 AM

## 2022-11-27 NOTE — Progress Notes (Signed)
Pulmonary Individual Treatment Plan  Patient Details  Name: Andrew [name] MRN: [id number] Date of Birth: April 29, 1955 Referring Provider:   Doristine Devoid Pulmonary Rehab Walk Test from 11/27/2022 in [facility]  Referring Provider [name]       Initial Encounter Date:  Flowsheet Row Pulmonary Rehab Walk Test from 11/27/2022 in [facility]  Date 11/27/22       Visit Diagnosis: Stage 2 moderate COPD by GOLD classification (HCC)  Patient's Home Medications on Admission:   Current Outpatient Medications:    albuterol (VENTOLIN HFA) 108 (90 Base) MCG/ACT inhaler, SMARTSIG:2 Puff(s) By Mouth Every 4 Hours PRN, Disp: , Rfl:    amLODipine-benazepril (LOTREL) 5-10 MG capsule, 1 tablet, Disp: , Rfl:    atorvastatin (LIPITOR) 10 MG tablet, Take 10 mg by mouth daily., Disp: , Rfl:    BREZTRI AEROSPHERE 160-9-4.8 MCG/ACT AERO, SMARTSIG:2 Puff(s) By Mouth Twice Daily, Disp: , Rfl:    ibuprofen (ADVIL) 200 MG tablet, 2 tablets with food or milk as needed, Disp: , Rfl:    Multiple Vitamin (MULTI VITAMIN) TABS, 1 tablet, Disp: , Rfl:    zolpidem (AMBIEN) 5 MG tablet, Take 5 mg by mouth at bedtime as needed for sleep., Disp: , Rfl:   Past Medical History: Past Medical History:  Diagnosis Date   Hyperlipidemia    Hypertension     Tobacco Use: Social History   Tobacco Use  Smoking Status Former   Current packs/day: 0.00   Average packs/day: 0.5 packs/day for 40.0 years (20.0 ttl pk-yrs)   Types: Cigarettes   Start date: 03/23/1978   Quit date: 03/23/2018   Years since quitting: 4.6  Smokeless Tobacco Never    Labs: Review Flowsheet       Latest Ref Rng & Units 06/16/2010  Labs for ITP Cardiac and Pulmonary Rehab  Cholestrol 0 - 200 mg/dL 841   LDL (calc) 0 - 99 mg/dL 660   HDL-C >63 mg/dL 41   Trlycerides <016 mg/dL 010     Details            Capillary Blood  Glucose: No results found for: "GLUCAP"   Pulmonary Assessment Scores:  Pulmonary Assessment Scores     Row Name 11/27/22 1051         ADL UCSD   ADL Phase Entry     SOB Score total 25       CAT Score   CAT Score 13       mMRC Score   mMRC Score 2             UCSD: Self-administered rating of dyspnea associated with activities of daily living (ADLs) 6-point scale (0 = "not at all" to 5 = "maximal or unable to do because of breathlessness")  Scoring Scores range from 0 to 120.  Minimally important difference is 5 units  CAT: CAT can identify the health impairment of COPD patients and is better correlated with disease progression.  CAT has a scoring range of zero to 40. The CAT score is classified into four groups of low (less than 10), medium (10 - 20), high (21-30) and very high (31-40) based on the impact level of disease on health status. A CAT score over 10 suggests significant symptoms.  A worsening CAT score could be explained by an exacerbation, poor medication adherence, poor inhaler technique, or progression of COPD or comorbid conditions.  CAT MCID is 2 points  mMRC: mMRC (Modified Medical Research Council) Dyspnea Scale is used to assess the degree of baseline functional disability in patients of respiratory disease due to dyspnea. No minimal important difference is established. A decrease in score of 1 point or greater is considered a positive change.   Pulmonary Function Assessment:  Pulmonary Function Assessment - 11/27/22 1049       Breath   Bilateral Breath Sounds Decreased    Shortness of Breath Yes;Limiting activity             Exercise Target Goals: Exercise Program Goal: Individual exercise prescription set using results from initial 6 min walk test and THRR while considering  patient's activity barriers and safety.   Exercise Prescription Goal: Initial exercise prescription builds to 30-45 minutes a day of aerobic activity, 2-3 days per  week.  Home exercise guidelines will be given to patient during program as part of exercise prescription that the participant will acknowledge.  Activity Barriers & Risk Stratification:  Activity Barriers & Cardiac Risk Stratification - 11/27/22 1059       Activity Barriers & Cardiac Risk Stratification   Activity Barriers Deconditioning;Muscular Weakness;Shortness of Breath             6 Minute Walk:  6 Minute Walk     Row Name 11/27/22 1150         6 Minute Walk   Phase Initial     Distance 1297 feet     Walk Time 6 minutes     # of Rest Breaks 0     MPH 2.46     METS 2.93     RPE 11     Perceived Dyspnea  1     VO2 Peak 10.24     Symptoms No     Resting HR 107 bpm     Resting BP 138/70     Resting Oxygen Saturation  95 %     Exercise Oxygen Saturation  during 6 min walk 92 %     Max Ex. HR 116 bpm     Max Ex. BP 160/60     2 Minute Post BP 136/66       Interval HR   1 Minute HR 111     2 Minute HR 113     3 Minute HR 113     4 Minute HR 116     5 Minute HR 116     6 Minute HR 116     2 Minute Post HR 108     Interval Heart Rate? Yes       Interval Oxygen   Interval Oxygen? Yes     Baseline Oxygen Saturation % 95 %     1 Minute Oxygen Saturation % 93 %     1 Minute Liters of Oxygen 0 L     2 Minute Oxygen Saturation % 92 %     2 Minute Liters of Oxygen 0 L     3 Minute Oxygen Saturation % 92 %     3 Minute Liters of Oxygen 0 L     4 Minute Oxygen Saturation % 93 %     4 Minute Liters of Oxygen 0 L     5 Minute Oxygen Saturation % 92 %     5 Minute Liters of Oxygen 0 L     6 Minute Oxygen Saturation % 92 %     6 Minute Liters of Oxygen 0 L  2 Minute Post Oxygen Saturation % 95 %     2 Minute Post Liters of Oxygen 0 L              Oxygen Initial Assessment:  Oxygen Initial Assessment - 11/27/22 1049       Home Oxygen   Home Oxygen Device None    Sleep Oxygen Prescription None    Home Exercise Oxygen Prescription None    Home  Resting Oxygen Prescription None      Initial 6 min Walk   Oxygen Used None      Program Oxygen Prescription   Program Oxygen Prescription None      Intervention   Short Term Goals To learn and understand importance of maintaining oxygen saturations>88%;To learn and demonstrate proper pursed lip breathing techniques or other breathing techniques. ;To learn and understand importance of monitoring SPO2 with pulse oximeter and demonstrate accurate use of the pulse oximeter.;To learn and demonstrate proper use of respiratory medications    Long  Term Goals Maintenance of O2 saturations>88%;Compliance with respiratory medication;Verbalizes importance of monitoring SPO2 with pulse oximeter and return demonstration;Exhibits proper breathing techniques, such as pursed lip breathing or other method taught during program session;Demonstrates proper use of MDI's             Oxygen Re-Evaluation:   Oxygen Discharge (Final Oxygen Re-Evaluation):   Initial Exercise Prescription:  Initial Exercise Prescription - 11/27/22 1100       Date of Initial Exercise RX and Referring Provider   Date 11/27/22    Referring Provider [name]    Expected Discharge Date 02/16/23      Treadmill   MPH 2    Grade 0    Minutes 15    METs 2.54      Bike   Level 1    Watts 42    Minutes 15    METs 2.4      Prescription Details   Frequency (times per week) 2    Duration Progress to 30 minutes of continuous aerobic without signs/symptoms of physical distress      Intensity   THRR 40-80% of Max Heartrate 60-121    Ratings of Perceived Exertion 11-13    Perceived Dyspnea 0-4      Progression   Progression Continue to progress workloads to maintain intensity without signs/symptoms of physical distress.      Resistance Training   Training Prescription Yes    Weight blue bands    Reps 10-15             Perform Capillary Blood Glucose checks as needed.  Exercise Prescription  Changes:   Exercise Comments:   Exercise Goals and Review:   Exercise Goals     Row Name 11/27/22 1100             Exercise Goals   Increase Physical Activity Yes       Intervention Provide advice, education, support and counseling about physical activity/exercise needs.;Develop an individualized exercise prescription for aerobic and resistive training based on initial evaluation findings, risk stratification, comorbidities and participant's personal goals.       Expected Outcomes Long Term: Exercising regularly at least 3-5 days a week.;Long Term: Add in home exercise to make exercise part of routine and to increase amount of physical activity.;Short Term: Attend rehab on a regular basis to increase amount of physical activity.       Increase Strength and Stamina Yes       Intervention Provide advice,  education, support and counseling about physical activity/exercise needs.;Develop an individualized exercise prescription for aerobic and resistive training based on initial evaluation findings, risk stratification, comorbidities and participant's personal goals.       Expected Outcomes Short Term: Increase workloads from initial exercise prescription for resistance, speed, and METs.;Short Term: Perform resistance training exercises routinely during rehab and add in resistance training at home;Long Term: Improve cardiorespiratory fitness, muscular endurance and strength as measured by increased METs and functional capacity ( )       Able to understand and use rate of perceived exertion (RPE) scale Yes       Intervention Provide education and explanation on how to use RPE scale       Expected Outcomes Short Term: Able to use RPE daily in rehab to express subjective intensity level;Long Term:  Able to use RPE to guide intensity level when exercising independently       Able to understand and use Dyspnea scale Yes       Intervention Provide education and explanation on how to use Dyspnea  scale       Expected Outcomes Short Term: Able to use Dyspnea scale daily in rehab to express subjective sense of shortness of breath during exertion;Long Term: Able to use Dyspnea scale to guide intensity level when exercising independently       Knowledge and understanding of Target Heart Rate Range (THRR) Yes       Intervention Provide education and explanation of THRR including how the numbers were predicted and where they are located for reference       Expected Outcomes Short Term: Able to state/look up THRR;Short Term: Able to use daily as guideline for intensity in rehab;Long Term: Able to use THRR to govern intensity when exercising independently       Understanding of Exercise Prescription Yes       Intervention Provide education, explanation, and written materials on patient's individual exercise prescription       Expected Outcomes Short Term: Able to explain program exercise prescription;Long Term: Able to explain home exercise prescription to exercise independently                Exercise Goals Re-Evaluation :   Discharge Exercise Prescription (Final Exercise Prescription Changes):   Nutrition:  Target Goals: Understanding of nutrition guidelines, daily intake of sodium 1500mg , cholesterol 200mg , calories 30% from fat and 7% or less from saturated fats, daily to have 5 or more servings of fruits and vegetables.  Biometrics:  Pre Biometrics - 11/27/22 1043       Pre Biometrics   Grip Strength 45 kg              Nutrition Therapy Plan and Nutrition Goals:   Nutrition Assessments:  MEDIFICTS Score Key: ?70 Need to make dietary changes  40-70 Heart Healthy Diet ? 40 Therapeutic Level Cholesterol Diet   Picture Your Plate Scores: <16 Unhealthy dietary pattern with much room for improvement. 41-50 Dietary pattern unlikely to meet recommendations for good health and room for improvement. 51-60 More healthful dietary pattern, with some room for improvement.   >60 Healthy dietary pattern, although there may be some specific behaviors that could be improved.    Nutrition Goals Re-Evaluation:   Nutrition Goals Discharge (Final Nutrition Goals Re-Evaluation):   Psychosocial: Target Goals: Acknowledge presence or absence of significant depression and/or stress, maximize coping skills, provide positive support system. Participant is able to verbalize types and ability to use techniques and skills needed for reducing  stress and depression.  Initial Review & Psychosocial Screening:  Initial Psych Review & Screening - 11/27/22 1056       Initial Review   Current issues with None Identified      Family Dynamics   Good Support System? Yes    Concerns --   none   Comments Pt denies concerns      Barriers   Psychosocial barriers to participate in program There are no identifiable barriers or psychosocial needs.      Screening Interventions   Interventions Encouraged to exercise    Expected Outcomes Long Term Goal: Stressors or current issues are controlled or eliminated.             Quality of Life Scores:  Scores of 19 and below usually indicate a poorer quality of life in these areas.  A difference of  2-3 points is a clinically meaningful difference.  A difference of 2-3 points in the total score of the Quality of Life Index has been associated with significant improvement in overall quality of life, self-image, physical symptoms, and general health in studies assessing change in quality of life.  PHQ-9: Review Flowsheet       11/27/2022  Depression screen PHQ 2/9  Decreased Interest 1  Down, Depressed, Hopeless 0  PHQ - 2 Score 1  Altered sleeping 1  Tired, decreased energy 1  Change in appetite 1  Feeling bad or failure about yourself  0  Trouble concentrating 0  Moving slowly or fidgety/restless 0  Suicidal thoughts 0  PHQ-9 Score 4  Difficult doing work/chores Not difficult at all    Details            Interpretation of Total Score  Total Score Depression Severity:  1-4 = Minimal depression, 5-9 = Mild depression, 10-14 = Moderate depression, 15-19 = Moderately severe depression, 20-27 = Severe depression   Psychosocial Evaluation and Intervention:  Psychosocial Evaluation - 11/27/22 1057       Psychosocial Evaluation & Interventions   Interventions Encouraged to exercise with the program and follow exercise prescription    Comments Bostyn denies any psychosocial barriers or concerns    Expected Outcomes For [place] to participate in PR without any barriers or concerns    Continue Psychosocial Services  No Follow up required             Psychosocial Re-Evaluation:   Psychosocial Discharge (Final Psychosocial Re-Evaluation):   Education: Education Goals: Education classes will be provided on a weekly basis, covering required topics. Participant will state understanding/return demonstration of topics presented.  Learning Barriers/Preferences:  Learning Barriers/Preferences - 11/27/22 1223       Learning Barriers/Preferences   Learning Barriers None    Learning Preferences Group Instruction;Verbal Instruction;Written Material             Education Topics: Introduction to Pulmonary Rehab Group instruction provided by PowerPoint, verbal discussion, and written material to support subject matter. Instructor reviews what Pulmonary Rehab is, the purpose of the program, and how patients are referred.     Know Your Numbers Group instruction that is supported by a PowerPoint presentation. Instructor discusses importance of knowing and understanding resting, exercise, and post-exercise oxygen saturation, heart rate, and blood pressure. Oxygen saturation, heart rate, blood pressure, rating of perceived exertion, and dyspnea are reviewed along with a normal range for these values.    Exercise for the Pulmonary Patient Group instruction that is supported by a PowerPoint  presentation. Instructor discusses benefits of exercise, core  components of exercise, frequency, duration, and intensity of an exercise routine, importance of utilizing pulse oximetry during exercise, safety while exercising, and options of places to exercise outside of rehab.     MET Level  Group instruction provided by PowerPoint, verbal discussion, and written material to support subject matter. Instructor reviews what METs are and how to increase METs.    Pulmonary Medications Verbally interactive group education provided by instructor with focus on inhaled medications and proper administration.   Anatomy and Physiology of the Respiratory System Group instruction provided by PowerPoint, verbal discussion, and written material to support subject matter. Instructor reviews respiratory cycle and anatomical components of the respiratory system and their functions. Instructor also reviews differences in obstructive and restrictive respiratory diseases with examples of each.    Oxygen Safety Group instruction provided by PowerPoint, verbal discussion, and written material to support subject matter. There is an overview of "What is Oxygen" and "Why do we need it".  Instructor also reviews how to create a safe environment for oxygen use, the importance of using oxygen as prescribed, and the risks of noncompliance. There is a brief discussion on traveling with oxygen and resources the patient may utilize.   Oxygen Use Group instruction provided by PowerPoint, verbal discussion, and written material to discuss how supplemental oxygen is prescribed and different types of oxygen supply systems. Resources for more information are provided.    Breathing Techniques Group instruction that is supported by demonstration and informational handouts. Instructor discusses the benefits of pursed lip and diaphragmatic breathing and detailed demonstration on how to perform both.     Risk Factor  Reduction Group instruction that is supported by a PowerPoint presentation. Instructor discusses the definition of a risk factor, different risk factors for pulmonary disease, and how the heart and lungs work together.   MD Day A group question and answer session with a medical doctor that allows participants to ask questions that relate to their pulmonary disease state.   Nutrition for the Pulmonary Patient Group instruction provided by PowerPoint slides, verbal discussion, and written materials to support subject matter. The instructor gives an explanation and review of healthy diet recommendations, which includes a discussion on weight management, recommendations for fruit and vegetable consumption, as well as protein, fluid, caffeine, fiber, sodium, sugar, and alcohol. Tips for eating when patients are short of breath are discussed.    Other Education Group or individual verbal, written, or video instructions that support the educational goals of the pulmonary rehab program.    Knowledge Questionnaire Score:  Knowledge Questionnaire Score - 11/27/22 1223       Knowledge Questionnaire Score   Pre Score 17/18             Core Components/Risk Factors/Patient Goals at Admission:  Personal Goals and Risk Factors at Admission - 11/27/22 1058       Core Components/Risk Factors/Patient Goals on Admission    Weight Management Yes    Intervention Weight Management/Obesity: Establish reasonable short term and long term weight goals.;Weight Management: Provide education and appropriate resources to help participant work on and attain dietary goals.;Weight Management: Develop a combined nutrition and exercise program designed to reach desired caloric intake, while maintaining appropriate intake of nutrient and fiber, sodium and fats, and appropriate energy expenditure required for the weight goal.;Obesity: Provide education and appropriate resources to help participant work on and attain  dietary goals.    Admit Weight 251 lb 12.3 oz (114.2 kg)    Goal Weight: Short Term  241 lb (109.3 kg)    Goal Weight: Long Term 220 lb (99.8 kg)    Expected Outcomes Short Term: Continue to assess and modify interventions until short term weight is achieved;Long Term: Adherence to nutrition and physical activity/exercise program aimed toward attainment of established weight goal;Weight Loss: Understanding of general recommendations for a balanced deficit meal plan, which promotes 1-2 lb weight loss per week and includes a negative energy balance of [phone] kcal/d;Understanding of distribution of calorie intake throughout the day with the consumption of 4-5 meals/snacks;Understanding recommendations for meals to include 15-35% energy as protein, 25-35% energy from fat, 35-60% energy from carbohydrates, less than 200mg  of dietary cholesterol, 20-35 gm of total fiber daily    Improve shortness of breath with ADL's Yes    Intervention Provide education, individualized exercise plan and daily activity instruction to help decrease symptoms of SOB with activities of daily living.    Expected Outcomes Short Term: Improve cardiorespiratory fitness to achieve a reduction of symptoms when performing ADLs;Long Term: Be able to perform more ADLs without symptoms or delay the onset of symptoms    Increase knowledge of respiratory medications and ability to use respiratory devices properly  Yes    Intervention Provide education and demonstration as needed of appropriate use of medications, inhalers, and oxygen therapy.    Expected Outcomes Short Term: Achieves understanding of medications use. Understands that oxygen is a medication prescribed by physician. Demonstrates appropriate use of inhaler and oxygen therapy.;Long Term: Maintain appropriate use of medications, inhalers, and oxygen therapy.             Core Components/Risk Factors/Patient Goals Review:    Core Components/Risk Factors/Patient Goals at  Discharge (Final Review):    ITP Comments:   Comments: Dr. Mechele Collin is Medical Director for Pulmonary Rehab at [facility].

## 2022-11-30 DIAGNOSIS — H26492 Other secondary cataract, left eye: Secondary | ICD-10-CM | POA: Diagnosis not present

## 2022-12-03 ENCOUNTER — Encounter (HOSPITAL_COMMUNITY)
Admission: RE | Admit: 2022-12-03 | Discharge: 2022-12-03 | Disposition: A | Discharge status: Home / Self Care | Payer: PPO | Source: Ambulatory Visit | Attending: Pulmonary Disease | Admitting: Pulmonary Disease

## 2022-12-03 DIAGNOSIS — J449 Chronic obstructive pulmonary disease, unspecified: Secondary | ICD-10-CM | POA: Diagnosis not present

## 2022-12-03 NOTE — Progress Notes (Signed)
Daily Session Note  Patient Details  Name: Andrew [name] MRN: [id number] Date of Birth: [birth year] Referring Provider:   Doristine Devoid Pulmonary Rehab Walk Test from 11/27/2022 in [facility]  Referring Provider [name]       Encounter Date: 12/03/2022  Check In:  Session Check In - 12/03/22 1133       Check-In   Supervising physician immediately available to respond to emergencies [facility] MD immediately available    Physician(s) Bernadene Person, NP    Location [facility]    Staff Present Essie Hart, RN, Doris Cheadle, MS, ACSM-CEP, Exercise Physiologist;Casey Erin Sons BS, ACSM-CEP, Exercise Physiologist;Samantha Belarus, RD, LDN    Virtual Visit No    Medication changes reported     No    Fall or balance concerns reported    No    Warm-up and Cool-down Performed as group-led instruction    Resistance Training Performed Yes    VAD Patient? No    PAD/SET Patient? No      Pain Assessment   Currently in Pain? No/denies    Multiple Pain Sites No             Capillary Blood Glucose: No results found for this or any previous visit (from the past 24 hour(s)).    Social History   Tobacco Use  Smoking Status Former   Current packs/day: 0.00   Average packs/day: 0.5 packs/day for 40.0 years (20.0 ttl pk-yrs)   Types: Cigarettes   Start date: 03/23/1978   Quit date: 03/23/2018   Years since quitting: 4.7  Smokeless Tobacco Never    Goals Met:  Exercise tolerated well No report of concerns or symptoms today Strength training completed today  Goals Unmet:  Not Applicable  Comments: Service time is from 1006 to 1141.    Dr. Mechele Collin is Medical Director for Pulmonary Rehab at [facility].

## 2022-12-08 ENCOUNTER — Encounter (HOSPITAL_COMMUNITY)
Admission: RE | Admit: 2022-12-08 | Discharge: 2022-12-08 | Disposition: A | Discharge status: Home / Self Care | Payer: PPO | Source: Ambulatory Visit | Attending: Pulmonary Disease

## 2022-12-08 VITALS — Wt 250.0 lb

## 2022-12-08 DIAGNOSIS — J449 Chronic obstructive pulmonary disease, unspecified: Secondary | ICD-10-CM | POA: Diagnosis not present

## 2022-12-08 NOTE — Progress Notes (Signed)
Daily Session Note  Patient Details  Name: Andrew [name] MRN: [id number] Date of Birth: [birth year] Referring Provider:   Doristine Devoid Pulmonary Rehab Walk Test from 11/27/2022 in [facility]  Referring Provider [name]       Encounter Date: 12/08/2022  Check In:  Session Check In - 12/08/22 1103       Check-In   Supervising physician immediately available to respond to emergencies [facility] immediately available    Physician(s) Jari Favre, NP    [facility]    Staff Present Essie Hart, RN, Doris Cheadle, MS, ACSM-CEP, Exercise Physiologist;Zissel Biederman Katrinka Blazing, RT;Randi Idelle Crouch BS, ACSM-CEP, Exercise Physiologist;Samantha Belarus, RD, Aleatha Borer, MS, ACSM-CEP, CCRP, Exercise Physiologist    Virtual Visit No    Medication changes reported     No    Fall or balance concerns reported    No    Tobacco Cessation No Change    Warm-up and Cool-down Performed as group-led instruction    Resistance Training Performed Yes    VAD Patient? No    PAD/SET Patient? No      Pain Assessment   Currently in Pain? No/denies    Multiple Pain Sites No             Capillary Blood Glucose: No results found for this or any previous visit (from the past 24 hour(s)).   Exercise Prescription Changes - 12/08/22 1100       Response to Exercise   Blood Pressure (Admit) 100/62    Blood Pressure (Exercise) 132/70    Blood Pressure (Exit) 106/70    Heart Rate (Admit) 104 bpm    Heart Rate (Exercise) 133 bpm    Heart Rate (Exit) 106 bpm    Oxygen Saturation (Admit) 95 %    Oxygen Saturation (Exercise) 93 %    Oxygen Saturation (Exit) 94 %    Rating of Perceived Exertion (Exercise) 13    Perceived Dyspnea (Exercise) 2    Duration Continue with 30 min of aerobic exercise without signs/symptoms of physical distress.    Intensity THRR unchanged      Progression   Progression Continue to progress workloads to  maintain intensity without signs/symptoms of physical distress.      Resistance Training   Training Prescription Yes    Weight blue bands    Reps 10-15    Time 10 Minutes      Treadmill   MPH 2    Grade 0    Minutes 15    METs 1.8      Bike   Level 2    Minutes 15    METs 2.4             Social History   Tobacco Use  Smoking Status Former   Current packs/day: 0.00   Average packs/day: 0.5 packs/day for 40.0 years (20.0 ttl pk-yrs)   Types: Cigarettes   Start date: 03/23/1978   Quit date: 03/23/2018   Years since quitting: 4.7  Smokeless Tobacco Never    Goals Met:  Proper associated with RPD/PD & O2 Sat Independence with exercise equipment Exercise tolerated well No report of concerns or symptoms today Strength training completed today  Goals Unmet:  Not Applicable  Comments: Service time is from 1009 to 1128.    Dr. Mechele Collin is Medical Director for Pulmonary Rehab at [facility].

## 2022-12-09 ENCOUNTER — Ambulatory Visit
Admission: RE | Admit: 2022-12-09 | Discharge: 2022-12-09 | Disposition: A | Discharge status: Home / Self Care | Payer: PPO | Source: Ambulatory Visit | Attending: Pulmonary Disease | Admitting: Pulmonary Disease

## 2022-12-09 DIAGNOSIS — K760 Fatty (change of) liver, not elsewhere classified: Secondary | ICD-10-CM | POA: Diagnosis not present

## 2022-12-09 DIAGNOSIS — I700 Atherosclerosis of aorta: Secondary | ICD-10-CM | POA: Diagnosis not present

## 2022-12-09 DIAGNOSIS — R918 Other nonspecific abnormal finding of lung field: Secondary | ICD-10-CM | POA: Diagnosis not present

## 2022-12-09 DIAGNOSIS — J439 Emphysema, unspecified: Secondary | ICD-10-CM | POA: Diagnosis not present

## 2022-12-09 DIAGNOSIS — R9389 Abnormal findings on diagnostic imaging of other specified body structures: Secondary | ICD-10-CM

## 2022-12-10 ENCOUNTER — Encounter (HOSPITAL_COMMUNITY)
Admission: RE | Admit: 2022-12-10 | Discharge: 2022-12-10 | Disposition: A | Discharge status: Home / Self Care | Payer: PPO | Source: Ambulatory Visit | Attending: Pulmonary Disease | Admitting: Pulmonary Disease

## 2022-12-10 DIAGNOSIS — J449 Chronic obstructive pulmonary disease, unspecified: Secondary | ICD-10-CM

## 2022-12-10 NOTE — Progress Notes (Signed)
Daily Session Note  Patient Details  Name: Andrew [name] MRN: [id number] Date of Birth: [birth year] Referring Provider:   Doristine Devoid Pulmonary Rehab Walk Test from 11/27/2022 in [facility]  Referring Provider [name]       Encounter Date: 12/10/2022  Check In:  Session Check In - 12/10/22 1126       Check-In   Supervising physician immediately available to respond to emergencies [facility] immediately available    Physician(s) Jari Favre, NP    [facility]    Staff Present Essie Hart, RN, Doris Cheadle, MS, ACSM-CEP, Exercise Physiologist;Kersti Scavone Erin Sons BS, ACSM-CEP, Exercise Physiologist    Virtual Visit No    Medication changes reported     No    Fall or balance concerns reported    No    Tobacco Cessation No Change    Warm-up and Cool-down Performed as group-led instruction    Resistance Training Performed Yes    VAD Patient? No    PAD/SET Patient? No      Pain Assessment   Currently in Pain? No/denies    Multiple Pain Sites No             Capillary Blood Glucose: No results found for this or any previous visit (from the past 24 hour(s)).    Social History   Tobacco Use  Smoking Status Former   Current packs/day: 0.00   Average packs/day: 0.5 packs/day for 40.0 years (20.0 ttl pk-yrs)   Types: Cigarettes   Start date: 03/23/1978   Quit date: 03/23/2018   Years since quitting: 4.7  Smokeless Tobacco Never    Goals Met:  Proper associated with RPD/PD & O2 Sat Independence with exercise equipment Exercise tolerated well No report of concerns or symptoms today Strength training completed today  Goals Unmet:  Not Applicable  Comments: Service time is from 1008 to 1158.    Dr. Mechele Collin is Medical Director for Pulmonary Rehab at [facility].

## 2022-12-15 ENCOUNTER — Encounter (HOSPITAL_COMMUNITY)
Admission: RE | Admit: 2022-12-15 | Discharge: 2022-12-15 | Disposition: A | Discharge status: Home / Self Care | Payer: PPO | Source: Ambulatory Visit | Attending: Pulmonary Disease

## 2022-12-15 DIAGNOSIS — J449 Chronic obstructive pulmonary disease, unspecified: Secondary | ICD-10-CM | POA: Diagnosis not present

## 2022-12-15 NOTE — Progress Notes (Signed)
Daily Session Note  Patient Details  Name: Andrew [name] MRN: [id number] Date of Birth: Apr 06, 1955 Referring Provider:   Doristine Devoid Pulmonary Rehab Walk Test from 11/27/2022 in [facility]  Referring Provider [name]       Encounter Date: 12/15/2022  Check In:  Session Check In - 12/15/22 1156       Check-In   Supervising physician immediately available to respond to emergencies [facility] MD immediately available    Physician(s) Carlos Levering, NP    [facility]    Staff Present Essie Hart, RN, Doris Cheadle, MS, ACSM-CEP, Exercise Physiologist;Marylouise Mallet Erin Sons BS, ACSM-CEP, Exercise Physiologist    Virtual Visit No    Medication changes reported     No    Fall or balance concerns reported    No    Tobacco Cessation No Change    Warm-up and Cool-down Performed as group-led instruction    Resistance Training Performed Yes    VAD Patient? No    PAD/SET Patient? No      Pain Assessment   Currently in Pain? No/denies    Multiple Pain Sites No             Capillary Blood Glucose: No results found for this or any previous visit (from the past 24 hour(s)).    Social History   Tobacco Use  Smoking Status Former   Current packs/day: 0.00   Average packs/day: 0.5 packs/day for 40.0 years (20.0 ttl pk-yrs)   Types: Cigarettes   Start date: 03/23/1978   Quit date: 03/23/2018   Years since quitting: 4.7  Smokeless Tobacco Never    Goals Met:  Proper associated with RPD/PD & O2 Sat Independence with exercise equipment Exercise tolerated well No report of concerns or symptoms today Strength training completed today  Goals Unmet:  Not Applicable  Comments: Service time is from 1004 to 1136.    Dr. Mechele Collin is Medical Director for Pulmonary Rehab at [facility].

## 2022-12-17 ENCOUNTER — Encounter (HOSPITAL_COMMUNITY)
Admission: RE | Admit: 2022-12-17 | Discharge: 2022-12-17 | Disposition: A | Discharge status: Home / Self Care | Payer: PPO | Source: Ambulatory Visit | Attending: Pulmonary Disease | Admitting: Pulmonary Disease

## 2022-12-17 DIAGNOSIS — J449 Chronic obstructive pulmonary disease, unspecified: Secondary | ICD-10-CM

## 2022-12-17 NOTE — Progress Notes (Signed)
Daily Session Note  Patient Details  Name: Andrew [name] MRN: [id number] Date of Birth: 12-11-1955 Referring Provider:   Doristine Devoid Pulmonary Rehab Walk Test from 11/27/2022 in [facility]  Referring Provider [name]       Encounter Date: 12/17/2022  Check In:  Session Check In - 12/17/22 1036       Check-In   Supervising physician immediately available to respond to emergencies [facility] immediately available    Physician(s) Edd Fabian, NP    [facility]    Staff Present Essie Hart, RN, Doris Cheadle, MS, ACSM-CEP, Exercise Physiologist;Casey Katrinka Blazing, RT;Darlisa Spruiell Idelle Crouch BS, ACSM-CEP, Exercise Physiologist;Samantha Belarus, RD, LDN    Virtual Visit No    Medication changes reported     No    Fall or balance concerns reported    No    Tobacco Cessation No Change    Warm-up and Cool-down Performed as group-led instruction    Resistance Training Performed Yes    VAD Patient? No    PAD/SET Patient? No      Pain Assessment   Currently in Pain? No/denies    Multiple Pain Sites No             Capillary Blood Glucose: No results found for this or any previous visit (from the past 24 hour(s)).    Social History   Tobacco Use  Smoking Status Former   Current packs/day: 0.00   Average packs/day: 0.5 packs/day for 40.0 years (20.0 ttl pk-yrs)   Types: Cigarettes   Start date: 03/23/1978   Quit date: 03/23/2018   Years since quitting: 4.7  Smokeless Tobacco Never    Goals Met:  Independence with exercise equipment Exercise tolerated well No report of concerns or symptoms today Strength training completed today  Goals Unmet:  Not Applicable  Comments: Service time is from 1015 to 1154.    Dr. Mechele Collin is Medical Director for Pulmonary Rehab at [facility].

## 2022-12-18 ENCOUNTER — Encounter: Payer: Self-pay | Admitting: Pulmonary Disease

## 2022-12-22 ENCOUNTER — Encounter (HOSPITAL_COMMUNITY)
Admission: RE | Admit: 2022-12-22 | Discharge: 2022-12-22 | Disposition: A | Discharge status: Home / Self Care | Payer: PPO | Source: Ambulatory Visit | Attending: Pulmonary Disease | Admitting: Pulmonary Disease

## 2022-12-22 VITALS — Wt 250.2 lb

## 2022-12-22 DIAGNOSIS — J449 Chronic obstructive pulmonary disease, unspecified: Secondary | ICD-10-CM | POA: Diagnosis not present

## 2022-12-22 NOTE — Progress Notes (Signed)
Daily Session Note  Patient Details  Name: Andrew [name] MRN: [id number] Date of Birth: 1955/11/27 Referring Provider:   Doristine Devoid Pulmonary Rehab Walk Test from 11/27/2022 in [facility]  Referring Provider [name]       Encounter Date: 12/22/2022  Check In:  Session Check In - 12/22/22 1124       Check-In   Supervising physician immediately available to respond to emergencies [facility] MD immediately available    Physician(s) Robin Searing, NP    Location [facility]    Staff Present Essie Hart, RN, Doris Cheadle, MS, ACSM-CEP, Exercise Physiologist;Ladavion Savitz Erin Sons BS, ACSM-CEP, Exercise Physiologist;Samantha Belarus, RD, LDN    Virtual Visit No    Medication changes reported     No    Fall or balance concerns reported    No    Tobacco Cessation No Change    Warm-up and Cool-down Performed as group-led instruction    Resistance Training Performed Yes    VAD Patient? No    PAD/SET Patient? No      Pain Assessment   Currently in Pain? No/denies    Multiple Pain Sites No             Capillary Blood Glucose: No results found for this or any previous visit (from the past 24 hour(s)).   Exercise Prescription Changes - 12/22/22 1100       Response to Exercise   Blood Pressure (Admit) 132/66    Blood Pressure (Exercise) 160/58    Blood Pressure (Exit) 132/60    Heart Rate (Admit) 99 bpm    Heart Rate (Exercise) 132 bpm    Heart Rate (Exit) 106 bpm    Oxygen Saturation (Admit) 94 %    Oxygen Saturation (Exercise) 91 %    Oxygen Saturation (Exit) 93 %    Rating of Perceived Exertion (Exercise) 13    Perceived Dyspnea (Exercise) 2    Duration Continue with 30 min of aerobic exercise without signs/symptoms of physical distress.    Intensity THRR New      Progression   Progression Continue to progress workloads to maintain intensity without signs/symptoms of physical distress.       Resistance Training   Training Prescription Yes    Weight blue bands    Reps 10-15    Time 10 Minutes      Treadmill   MPH 2.2    Grade 0    Minutes 15    METs 2.5      Bike   Level 2.2    Minutes 15    METs 2.6             Social History   Tobacco Use  Smoking Status Former   Current packs/day: 0.00   Average packs/day: 0.5 packs/day for 40.0 years (20.0 ttl pk-yrs)   Types: Cigarettes   Start date: 03/23/1978   Quit date: 03/23/2018   Years since quitting: 4.7  Smokeless Tobacco Never    Goals Met:  Proper associated with RPD/PD & O2 Sat Independence with exercise equipment Exercise tolerated well No report of concerns or symptoms today Strength training completed today  Goals Unmet:  Not Applicable  Comments: Service time is from 1020 to 1139.    Dr. Mechele Collin is Medical Director for Pulmonary Rehab at [facility].

## 2022-12-23 NOTE — Progress Notes (Signed)
Pulmonary Individual Treatment Plan  Patient Details  Name: Andrew [name] MRN: [id number] Date of Birth: [birth year] Referring Provider:   Doristine Devoid Pulmonary Rehab Walk Test from 11/27/2022 in [facility]  Referring Provider [name]       Initial Encounter Date:  Flowsheet Row Pulmonary Rehab Walk Test from 11/27/2022 in [facility]  Date 11/27/22       Visit Diagnosis: Stage 2 moderate COPD by GOLD classification (HCC)  Patient's Home Medications on Admission:   Current Outpatient Medications:    albuterol (VENTOLIN HFA) 108 (90 Base) MCG/ACT inhaler, SMARTSIG:2 Puff(s) By Mouth Every 4 Hours PRN, Disp: , Rfl:    amLODipine-benazepril (LOTREL) 5-10 MG capsule, 1 tablet, Disp: , Rfl:    atorvastatin (LIPITOR) 10 MG tablet, Take 10 mg by mouth daily., Disp: , Rfl:    BREZTRI AEROSPHERE 160-9-4.8 MCG/ACT AERO, SMARTSIG:2 Puff(s) By Mouth Twice Daily, Disp: , Rfl:    ibuprofen (ADVIL) 200 MG tablet, 2 tablets with food or milk as needed, Disp: , Rfl:    Multiple Vitamin (MULTI VITAMIN) TABS, 1 tablet, Disp: , Rfl:    zolpidem (AMBIEN) 5 MG tablet, Take 5 mg by mouth at bedtime as needed for sleep., Disp: , Rfl:   Past Medical History: Past Medical History:  Diagnosis Date   Hyperlipidemia    Hypertension     Tobacco Use: Social History   Tobacco Use  Smoking Status Former   Current packs/day: 0.00   Average packs/day: 0.5 packs/day for 40.0 years (20.0 ttl pk-yrs)   Types: Cigarettes   Start date: 03/23/1978   Quit date: 03/23/2018   Years since quitting: 4.7  Smokeless Tobacco Never    Labs: Review Flowsheet       Latest Ref Rng & Units 06/16/2010  Labs for ITP Cardiac and Pulmonary Rehab  Cholestrol 0 - 200 mg/dL 409   LDL (calc) 0 - 99 mg/dL 811   HDL-C >91 mg/dL 41   Trlycerides <478 mg/dL 295     Details            Capillary Blood  Glucose: No results found for: "GLUCAP"   Pulmonary Assessment Scores:  Pulmonary Assessment Scores     Row Name 11/27/22 1051         ADL UCSD   ADL Phase Entry     SOB Score total 25       CAT Score   CAT Score 13       mMRC Score   mMRC Score 2             UCSD: Self-administered rating of dyspnea associated with activities of daily living (ADLs) 6-point scale (0 = "not at all" to 5 = "maximal or unable to do because of breathlessness")  Scoring Scores range from 0 to 120.  Minimally important difference is 5 units  CAT: CAT can identify the health impairment of COPD patients and is better correlated with disease progression.  CAT has a scoring range of zero to 40. The CAT score is classified into four groups of low (less than 10), medium (10 - 20), high (21-30) and very high (31-40) based on the impact level of disease on health status. A CAT score over 10 suggests significant symptoms.  A worsening CAT score could be explained by an exacerbation, poor medication adherence, poor inhaler technique, or progression of COPD or comorbid conditions.  CAT MCID is 2 points  mMRC: mMRC (Modified Medical Research Council) Dyspnea Scale is used to assess the degree of baseline functional disability in patients of respiratory disease due to dyspnea. No minimal important difference is established. A decrease in score of 1 point or greater is considered a positive change.   Pulmonary Function Assessment:  Pulmonary Function Assessment - 11/27/22 1049       Breath   Bilateral Breath Sounds Decreased    Shortness of Breath Yes;Limiting activity             Exercise Target Goals: Exercise Program Goal: Individual exercise prescription set using results from initial 6 min walk test and THRR while considering  patient's activity barriers and safety.   Exercise Prescription Goal: Initial exercise prescription builds to 30-45 minutes a day of aerobic activity, 2-3 days per  week.  Home exercise guidelines will be given to patient during program as part of exercise prescription that the participant will acknowledge.  Activity Barriers & Risk Stratification:  Activity Barriers & Cardiac Risk Stratification - 11/27/22 1059       Activity Barriers & Cardiac Risk Stratification   Activity Barriers Deconditioning;Muscular Weakness;Shortness of Breath             6 Minute Walk:  6 Minute Walk     Row Name 11/27/22 1150         6 Minute Walk   Phase Initial     Distance 1297 feet     Walk Time 6 minutes     # of Rest Breaks 0     MPH 2.46     METS 2.93     RPE 11     Perceived Dyspnea  1     VO2 Peak 10.24     Symptoms No     Resting HR 107 bpm     Resting BP 138/70     Resting Oxygen Saturation  95 %     Exercise Oxygen Saturation  during 6 min walk 92 %     Max Ex. HR 116 bpm     Max Ex. BP 160/60     2 Minute Post BP 136/66       Interval HR   1 Minute HR 111     2 Minute HR 113     3 Minute HR 113     4 Minute HR 116     5 Minute HR 116     6 Minute HR 116     2 Minute Post HR 108     Interval Heart Rate? Yes       Interval Oxygen   Interval Oxygen? Yes     Baseline Oxygen Saturation % 95 %     1 Minute Oxygen Saturation % 93 %     1 Minute Liters of Oxygen 0 L     2 Minute Oxygen Saturation % 92 %     2 Minute Liters of Oxygen 0 L     3 Minute Oxygen Saturation % 92 %     3 Minute Liters of Oxygen 0 L     4 Minute Oxygen Saturation % 93 %     4 Minute Liters of Oxygen 0 L     5 Minute Oxygen Saturation % 92 %     5 Minute Liters of Oxygen 0 L     6 Minute Oxygen Saturation % 92 %     6 Minute Liters of Oxygen 0 L  2 Minute Post Oxygen Saturation % 95 %     2 Minute Post Liters of Oxygen 0 L              Oxygen Initial Assessment:  Oxygen Initial Assessment - 11/27/22 1049       Home Oxygen   Home Oxygen Device None    Sleep Oxygen Prescription None    Home Exercise Oxygen Prescription None    Home  Resting Oxygen Prescription None      Initial 6 min Walk   Oxygen Used None      Program Oxygen Prescription   Program Oxygen Prescription None      Intervention   Short Term Goals To learn and understand importance of maintaining oxygen saturations>88%;To learn and demonstrate proper pursed lip breathing techniques or other breathing techniques. ;To learn and understand importance of monitoring SPO2 with pulse oximeter and demonstrate accurate use of the pulse oximeter.;To learn and demonstrate proper use of respiratory medications    Long  Term Goals Maintenance of O2 saturations>88%;Compliance with respiratory medication;Verbalizes importance of monitoring SPO2 with pulse oximeter and return demonstration;Exhibits proper breathing techniques, such as pursed lip breathing or other method taught during program session;Demonstrates proper use of MDI's             Oxygen Re-Evaluation:  Oxygen Re-Evaluation     Row Name 12/16/22 0707             Program Oxygen Prescription   Program Oxygen Prescription None         Home Oxygen   Home Oxygen Device None       Sleep Oxygen Prescription None       Home Exercise Oxygen Prescription None       Home Resting Oxygen Prescription None         Goals/Expected Outcomes   Short Term Goals To learn and understand importance of maintaining oxygen saturations>88%;To learn and demonstrate proper pursed lip breathing techniques or other breathing techniques. ;To learn and understand importance of monitoring SPO2 with pulse oximeter and demonstrate accurate use of the pulse oximeter.;To learn and demonstrate proper use of respiratory medications       Long  Term Goals Maintenance of O2 saturations>88%;Compliance with respiratory medication;Verbalizes importance of monitoring SPO2 with pulse oximeter and return demonstration;Exhibits proper breathing techniques, such as pursed lip breathing or other method taught during program session;Demonstrates  proper use of MDI's       Goals/Expected Outcomes Compliance and understanding of oxygen saturation monitoring and breathing techniques to decrease shortness of breath.                Oxygen Discharge (Final Oxygen Re-Evaluation):  Oxygen Re-Evaluation - 12/16/22 0707       Program Oxygen Prescription   Program Oxygen Prescription None      Home Oxygen   Home Oxygen Device None    Sleep Oxygen Prescription None    Home Exercise Oxygen Prescription None    Home Resting Oxygen Prescription None      Goals/Expected Outcomes   Short Term Goals To learn and understand importance of maintaining oxygen saturations>88%;To learn and demonstrate proper pursed lip breathing techniques or other breathing techniques. ;To learn and understand importance of monitoring SPO2 with pulse oximeter and demonstrate accurate use of the pulse oximeter.;To learn and demonstrate proper use of respiratory medications    Long  Term Goals Maintenance of O2 saturations>88%;Compliance with respiratory medication;Verbalizes importance of monitoring SPO2 with pulse oximeter and return demonstration;Exhibits proper  breathing techniques, such as pursed lip breathing or other method taught during program session;Demonstrates proper use of MDI's    Goals/Expected Outcomes Compliance and understanding of oxygen saturation monitoring and breathing techniques to decrease shortness of breath.             Initial Exercise Prescription:  Initial Exercise Prescription - 11/27/22 1100       Date of Initial Exercise RX and Referring Provider   Date 11/27/22    Referring Provider [name]    Expected Discharge Date 02/16/23      Treadmill   MPH 2    Grade 0    Minutes 15    METs 2.54      Bike   Level 1    Watts 42    Minutes 15    METs 2.4      Prescription Details   Frequency (times per week) 2    Duration Progress to 30 minutes of continuous aerobic without signs/symptoms of physical distress       Intensity   THRR 40-80% of Max Heartrate 60-121    Ratings of Perceived Exertion 11-13    Perceived Dyspnea 0-4      Progression   Progression Continue to progress workloads to maintain intensity without signs/symptoms of physical distress.      Resistance Training   Training Prescription Yes    Weight blue bands    Reps 10-15             Perform Capillary Blood Glucose checks as needed.  Exercise Prescription Changes:   Exercise Prescription Changes     Row Name 12/08/22 1100 12/22/22 1100           Response to Exercise   Blood Pressure (Admit) 100/62 132/66      Blood Pressure (Exercise) 132/70 160/58      Blood Pressure (Exit) 106/70 132/60      Heart Rate (Admit) 104 bpm 99 bpm      Heart Rate (Exercise) 133 bpm 132 bpm      Heart Rate (Exit) 106 bpm 106 bpm      Oxygen Saturation (Admit) 95 % 94 %      Oxygen Saturation (Exercise) 93 % 91 %      Oxygen Saturation (Exit) 94 % 93 %      Rating of Perceived Exertion (Exercise) 13 13      Perceived Dyspnea (Exercise) 2 2      Duration Continue with 30 min of aerobic exercise without signs/symptoms of physical distress. Continue with 30 min of aerobic exercise without signs/symptoms of physical distress.      Intensity THRR unchanged THRR New        Progression   Progression Continue to progress workloads to maintain intensity without signs/symptoms of physical distress. Continue to progress workloads to maintain intensity without signs/symptoms of physical distress.        Resistance Training   Training Prescription Yes Yes      Weight blue bands blue bands      Reps 10-15 10-15      Time 10 Minutes 10 Minutes        Treadmill   MPH 2 2.2      Grade 0 0      Minutes 15 15      METs 1.8 2.5        Bike   Level 2 2.2      Minutes 15 15      METs 2.4 2.6  Exercise Comments:   Exercise Comments     Row Name 12/03/22 1219           Exercise Comments Pt completed first day of  group exercise. He exercised on the treadmill for 15 min, speed 2.0, incline 0, METs 2.53. His HR got to 155. He then exercised on the scifit for 15 min, level 2, METs 2.4. HR 141. Will progress as tolerated. He performed warm up and cool down without limitations. Discussed METs with good reception. He is motivated.                Exercise Goals and Review:   Exercise Goals     Row Name 11/27/22 1100             Exercise Goals   Increase Physical Activity Yes       Intervention Provide advice, education, support and counseling about physical activity/exercise needs.;Develop an individualized exercise prescription for aerobic and resistive training based on initial evaluation findings, risk stratification, comorbidities and participant's personal goals.       Expected Outcomes Long Term: Exercising regularly at least 3-5 days a week.;Long Term: Add in home exercise to make exercise part of routine and to increase amount of physical activity.;Short Term: Attend rehab on a regular basis to increase amount of physical activity.       Increase Strength and Stamina Yes       Intervention Provide advice, education, support and counseling about physical activity/exercise needs.;Develop an individualized exercise prescription for aerobic and resistive training based on initial evaluation findings, risk stratification, comorbidities and participant's personal goals.       Expected Outcomes Short Term: Increase workloads from initial exercise prescription for resistance, speed, and METs.;Short Term: Perform resistance training exercises routinely during rehab and add in resistance training at home;Long Term: Improve cardiorespiratory fitness, muscular endurance and strength as measured by increased METs and functional capacity ( )       Able to understand and use rate of perceived exertion (RPE) scale Yes       Intervention Provide education and explanation on how to use RPE scale       Expected  Outcomes Short Term: Able to use RPE daily in rehab to express subjective intensity level;Long Term:  Able to use RPE to guide intensity level when exercising independently       Able to understand and use Dyspnea scale Yes       Intervention Provide education and explanation on how to use Dyspnea scale       Expected Outcomes Short Term: Able to use Dyspnea scale daily in rehab to express subjective sense of shortness of breath during exertion;Long Term: Able to use Dyspnea scale to guide intensity level when exercising independently       Knowledge and understanding of Target Heart Rate Range (THRR) Yes       Intervention Provide education and explanation of THRR including how the numbers were predicted and where they are located for reference       Expected Outcomes Short Term: Able to state/look up THRR;Short Term: Able to use daily as guideline for intensity in rehab;Long Term: Able to use THRR to govern intensity when exercising independently       Understanding of Exercise Prescription Yes       Intervention Provide education, explanation, and written materials on patient's individual exercise prescription       Expected Outcomes Short Term: Able to explain program exercise prescription;Long Term: Able to explain  home exercise prescription to exercise independently                Exercise Goals Re-Evaluation :  Exercise Goals Re-Evaluation     Row Name 12/16/22 0653             Exercise Goal Re-Evaluation   Exercise Goals Review Increase Physical Activity;Increase Strength and Stamina;Able to understand and use rate of perceived exertion (RPE) scale;Able to understand and use Dyspnea scale;Knowledge and understanding of Target Heart Rate Range (THRR);Understanding of Exercise Prescription       Comments Andrew [name] has completed 4 exercise sessions. He exercises for 15 min on the treadmill and upright bike. He averages 2.54 METs at 2 mph and 0 incline on the treadmill and 2.4 METs at  level 2 on the upright bike. Andrew [name] performs the warmup and cooldown standing without limitations. He has increased his workload slightly on the treadmill. This is due to him tolerating the treadmill better. He level has remained the same on the upright bike. It is too soon to notate any discernable progressions. Will continue to monitor and progress as able.       Expected Outcomes Through exercise at rehab and home, the patient will decrease shortness of breath with daily activities and feel confident in carrying out an exercise regimen at home.                Discharge Exercise Prescription (Final Exercise Prescription Changes):  Exercise Prescription Changes - 12/22/22 1100       Response to Exercise   Blood Pressure (Admit) 132/66    Blood Pressure (Exercise) 160/58    Blood Pressure (Exit) 132/60    Heart Rate (Admit) 99 bpm    Heart Rate (Exercise) 132 bpm    Heart Rate (Exit) 106 bpm    Oxygen Saturation (Admit) 94 %    Oxygen Saturation (Exercise) 91 %    Oxygen Saturation (Exit) 93 %    Rating of Perceived Exertion (Exercise) 13    Perceived Dyspnea (Exercise) 2    Duration Continue with 30 min of aerobic exercise without signs/symptoms of physical distress.    Intensity THRR New      Progression   Progression Continue to progress workloads to maintain intensity without signs/symptoms of physical distress.      Resistance Training   Training Prescription Yes    Weight blue bands    Reps 10-15    Time 10 Minutes      Treadmill   MPH 2.2    Grade 0    Minutes 15    METs 2.5      Bike   Level 2.2    Minutes 15    METs 2.6             Nutrition:  Target Goals: Understanding of nutrition guidelines, daily intake of sodium 1500mg , cholesterol 200mg , calories 30% from fat and 7% or less from saturated fats, daily to have 5 or more servings of fruits and vegetables.  Biometrics:  Pre Biometrics - 11/27/22 1043       Pre Biometrics   Grip Strength 45 kg               Nutrition Therapy Plan and Nutrition Goals:  Nutrition Therapy & Goals - 12/03/22 1143       Nutrition Therapy   Diet Heart Healthy Diet    Drug/Food Interactions Statins/Certain Fruits      Personal Nutrition Goals   Nutrition Goal Patient to  improve diet quality by using the plate method as a guide for meal planning to include lean protein/plant protein, fruits, vegetables, whole grains, nonfat dairy as part of a well-balanced diet.    Personal Goal #2 Patient to identify strategies or weight loss of 0.5-2.0# per week.    Comments Andrew [name] reports motivation to lose weight. He eats two meals daily and enjoys some cooking and convenience type cooking using frozen veggies, etc. He continues to limit sodium in his diet. Patient will benefit from participation in pulmonary rehab for nutrition, exercise, and lifestyle modification.      Intervention Plan   Intervention Prescribe, educate and counsel regarding individualized specific dietary modifications aiming towards targeted core components such as weight, hypertension, lipid management, diabetes, heart failure and other comorbidities.;Nutrition handout(s) given to patient.    Expected Outcomes Short Term Goal: Understand basic principles of dietary content, such as calories, fat, sodium, cholesterol and nutrients.;Long Term Goal: Adherence to prescribed nutrition plan.             Nutrition Assessments:  Nutrition Assessments - 12/03/22 1452       Rate Your Plate Scores   Pre Score 42            MEDIFICTS Score Key: >=70 Need to make dietary changes  40-70 Heart Healthy Diet <= 40 Therapeutic Level Cholesterol Diet  Flowsheet Row PULMONARY REHAB CHRONIC OBSTRUCTIVE PULMONARY DISEASE from 12/03/2022 in [facility]  Picture Your Plate Total Score on Admission 42      Picture Your Plate Scores: <16 Unhealthy dietary pattern with much room for  improvement. 41-50 Dietary pattern unlikely to Andrew recommendations for good health and room for improvement. 51-60 More healthful dietary pattern, with some room for improvement.  >60 Healthy dietary pattern, although there may be some specific behaviors that could be improved.    Nutrition Goals Re-Evaluation:  Nutrition Goals Re-Evaluation     Row Name 12/03/22 1143             Goals   Current Weight 247 lb 12.8 oz (112.4 kg)       Comment triglycerides 184, LDL 101       Expected Outcome Andrew [name] reports motivation to lose weight. He eats two meals daily and enjoys some cooking and convenience type cooking using frozen veggies, etc. He continues to limit sodium in his diet. Patient will benefit from participation in pulmonary rehab for nutrition, exercise, and lifestyle modification.                Nutrition Goals Discharge (Final Nutrition Goals Re-Evaluation):  Nutrition Goals Re-Evaluation - 12/03/22 1143       Goals   Current Weight 247 lb 12.8 oz (112.4 kg)    Comment triglycerides 184, LDL 101    Expected Outcome Andrew [name] reports motivation to lose weight. He eats two meals daily and enjoys some cooking and convenience type cooking using frozen veggies, etc. He continues to limit sodium in his diet. Patient will benefit from participation in pulmonary rehab for nutrition, exercise, and lifestyle modification.             Psychosocial: Target Goals: Acknowledge presence or absence of significant depression and/or stress, maximize coping skills, provide positive support system. Participant is able to verbalize types and ability to use techniques and skills needed for reducing stress and depression.  Initial Review & Psychosocial Screening:  Initial Psych Review & Screening - 11/27/22 1056  Initial Review   Current issues with None Identified      Family Dynamics   Good Support System? Yes    Concerns --   none   Comments Pt denies concerns      Barriers    Psychosocial barriers to participate in program There are no identifiable barriers or psychosocial needs.      Screening Interventions   Interventions Encouraged to exercise    Expected Outcomes Long Term Goal: Stressors or current issues are controlled or eliminated.             Quality of Life Scores:  Scores of 19 and below usually indicate a poorer quality of life in these areas.  A difference of  2-3 points is a clinically meaningful difference.  A difference of 2-3 points in the total score of the Quality of Life Index has been associated with significant improvement in overall quality of life, self-image, physical symptoms, and general health in studies assessing change in quality of life.  PHQ-9: Review Flowsheet       11/27/2022  Depression screen PHQ 2/9  Decreased Interest 1  Down, Depressed, Hopeless 0  PHQ - 2 Score 1  Altered sleeping 1  Tired, decreased energy 1  Change in appetite 1  Feeling bad or failure about yourself  0  Trouble concentrating 0  Moving slowly or fidgety/restless 0  Suicidal thoughts 0  PHQ-9 Score 4  Difficult doing work/chores Not difficult at all    Details           Interpretation of Total Score  Total Score Depression Severity:  1-4 = Minimal depression, 5-9 = Mild depression, 10-14 = Moderate depression, 15-19 = Moderately severe depression, 20-27 = Severe depression   Psychosocial Evaluation and Intervention:  Psychosocial Evaluation - 11/27/22 1057       Psychosocial Evaluation & Interventions   Interventions Encouraged to exercise with the program and follow exercise prescription    Comments Estill denies any psychosocial barriers or concerns    Expected Outcomes For [place] to participate in PR without any barriers or concerns    Continue Psychosocial Services  No Follow up required             Psychosocial Re-Evaluation:  Psychosocial Re-Evaluation     Row Name 12/18/22 1047             Psychosocial  Re-Evaluation   Current issues with None Identified       Comments Andrew [name] denies any new psychosocial barriers or concerns at this time.       Expected Outcomes For Andrew [name] to participate in PR free of any psychosocial barriers or concerns       Interventions Encouraged to attend Pulmonary Rehabilitation for the exercise       Continue Psychosocial Services  No Follow up required                Psychosocial Discharge (Final Psychosocial Re-Evaluation):  Psychosocial Re-Evaluation - 12/18/22 1047       Psychosocial Re-Evaluation   Current issues with None Identified    Comments Andrew [name] denies any new psychosocial barriers or concerns at this time.    Expected Outcomes For Andrew [name] to participate in PR free of any psychosocial barriers or concerns    Interventions Encouraged to attend Pulmonary Rehabilitation for the exercise    Continue Psychosocial Services  No Follow up required             Education: Education Goals: Education classes  will be provided on a weekly basis, covering required topics. Participant will state understanding/return demonstration of topics presented.  Learning Barriers/Preferences:  Learning Barriers/Preferences - 11/27/22 1223       Learning Barriers/Preferences   Learning Barriers None    Learning Preferences Group Instruction;Verbal Instruction;Written Material             Education Topics: Know Your Numbers Group instruction that is supported by a PowerPoint presentation. Instructor discusses importance of knowing and understanding resting, exercise, and post-exercise oxygen saturation, heart rate, and blood pressure. Oxygen saturation, heart rate, blood pressure, rating of perceived exertion, and dyspnea are reviewed along with a normal range for these values.    Exercise for the Pulmonary Patient Group instruction that is supported by a PowerPoint presentation. Instructor discusses benefits of exercise, core components of exercise, frequency,  duration, and intensity of an exercise routine, importance of utilizing pulse oximetry during exercise, safety while exercising, and options of places to exercise outside of rehab.  Flowsheet Row PULMONARY REHAB CHRONIC OBSTRUCTIVE PULMONARY DISEASE from 12/17/2022 in [facility] for Heart, Vascular, & Lung Health  Date 12/17/22  Educator EP  Instruction Review Code 1- Verbalizes Understanding       MET Level  Group instruction provided by PowerPoint, verbal discussion, and written material to support subject matter. Instructor reviews what METs are and how to increase METs.    Pulmonary Medications Verbally interactive group education provided by instructor with focus on inhaled medications and proper administration. Flowsheet Row PULMONARY REHAB CHRONIC OBSTRUCTIVE PULMONARY DISEASE from 12/10/2022 in [facility] for Heart, Vascular, & Lung Health  Date 12/10/22  Educator RT  Instruction Review Code 1- Verbalizes Understanding       Anatomy and Physiology of the Respiratory System Group instruction provided by PowerPoint, verbal discussion, and written material to support subject matter. Instructor reviews respiratory cycle and anatomical components of the respiratory system and their functions. Instructor also reviews differences in obstructive and restrictive respiratory diseases with examples of each.  Flowsheet Row PULMONARY REHAB CHRONIC OBSTRUCTIVE PULMONARY DISEASE from 12/03/2022 in [facility] for Heart, Vascular, & Lung Health  Date 12/03/22  Educator RT  Instruction Review Code 1- Verbalizes Understanding       Oxygen Safety Group instruction provided by PowerPoint, verbal discussion, and written material to support subject matter. There is an overview of "What is Oxygen" and "Why do we need it".  Instructor also reviews how to create a safe environment for oxygen use, the importance of using oxygen as  prescribed, and the risks of noncompliance. There is a brief discussion on traveling with oxygen and resources the patient may utilize.   Oxygen Use Group instruction provided by PowerPoint, verbal discussion, and written material to discuss how supplemental oxygen is prescribed and different types of oxygen supply systems. Resources for more information are provided.    Breathing Techniques Group instruction that is supported by demonstration and informational handouts. Instructor discusses the benefits of pursed lip and diaphragmatic breathing and detailed demonstration on how to perform both.     Risk Factor Reduction Group instruction that is supported by a PowerPoint presentation. Instructor discusses the definition of a risk factor, different risk factors for pulmonary disease, and how the heart and lungs work together.   Pulmonary Diseases Group instruction provided by PowerPoint, verbal discussion, and written material to support subject matter. Instructor gives an overview of the different type of pulmonary diseases. There is also a discussion  on risk factors and symptoms as well as ways to manage the diseases.   Stress and Energy Conservation Group instruction provided by PowerPoint, verbal discussion, and written material to support subject matter. Instructor gives an overview of stress and the impact it can have on the body. Instructor also reviews ways to reduce stress. There is also a discussion on energy conservation and ways to conserve energy throughout the day.   Warning Signs and Symptoms Group instruction provided by PowerPoint, verbal discussion, and written material to support subject matter. Instructor reviews warning signs and symptoms of stroke, heart attack, cold and flu. Instructor also reviews ways to prevent the spread of infection.   Other Education Group or individual verbal, written, or video instructions that support the educational goals of the pulmonary  rehab program.    Knowledge Questionnaire Score:  Knowledge Questionnaire Score - 11/27/22 1223       Knowledge Questionnaire Score   Pre Score 17/18             Core Components/Risk Factors/Patient Goals at Admission:  Personal Goals and Risk Factors at Admission - 11/27/22 1058       Core Components/Risk Factors/Patient Goals on Admission    Weight Management Yes    Intervention Weight Management/Obesity: Establish reasonable short term and long term weight goals.;Weight Management: Provide education and appropriate resources to help participant work on and attain dietary goals.;Weight Management: Develop a combined nutrition and exercise program designed to reach desired caloric intake, while maintaining appropriate intake of nutrient and fiber, sodium and fats, and appropriate energy expenditure required for the weight goal.;Obesity: Provide education and appropriate resources to help participant work on and attain dietary goals.    Admit Weight 251 lb 12.3 oz (114.2 kg)    Goal Weight: Short Term 241 lb (109.3 kg)    Goal Weight: Long Term 220 lb (99.8 kg)    Expected Outcomes Short Term: Continue to assess and modify interventions until short term weight is achieved;Long Term: Adherence to nutrition and physical activity/exercise program aimed toward attainment of established weight goal;Weight Loss: Understanding of general recommendations for a balanced deficit meal plan, which promotes 1-2 lb weight loss per week and includes a negative energy balance of [phone] kcal/d;Understanding of distribution of calorie intake throughout the day with the consumption of 4-5 meals/snacks;Understanding recommendations for meals to include 15-35% energy as protein, 25-35% energy from fat, 35-60% energy from carbohydrates, less than 200mg  of dietary cholesterol, 20-35 gm of total fiber daily    Improve shortness of breath with ADL's Yes    Intervention Provide education, individualized exercise  plan and daily activity instruction to help decrease symptoms of SOB with activities of daily living.    Expected Outcomes Short Term: Improve cardiorespiratory fitness to achieve a reduction of symptoms when performing ADLs;Long Term: Be able to perform more ADLs without symptoms or delay the onset of symptoms    Increase knowledge of respiratory medications and ability to use respiratory devices properly  Yes    Intervention Provide education and demonstration as needed of appropriate use of medications, inhalers, and oxygen therapy.    Expected Outcomes Short Term: Achieves understanding of medications use. Understands that oxygen is a medication prescribed by physician. Demonstrates appropriate use of inhaler and oxygen therapy.;Long Term: Maintain appropriate use of medications, inhalers, and oxygen therapy.             Core Components/Risk Factors/Patient Goals Review:   Goals and Risk Factor Review     Row  Name 12/18/22 1048             Core Components/Risk Factors/Patient Goals Review   Personal Goals Review Weight Management/Obesity;Improve shortness of breath with ADL's       Review Goal progressing for weight loss. Andrew [name] is working with our dietician for weight loss goal. Goal progressing on improving her shortness of breath with ADLs. Goal met on developing more efficient breathing techniques such as purse lipped breathing and diaphragmatic breathing; and practicing self-pacing with activity. Andrew [name] is able to demonstrate purse lip breathing when he gets SOB. He also paces himself when walking on the treadmill. Goal met for increasing knowledge of respiratory medications and ability to use respiratory devices properly. He has demonstrated proper technique when using his MDI to the respiratory therapist.       Expected Outcomes For Andrew [name] to lose weight and improve his shortness of breath with ADL's.                Core Components/Risk Factors/Patient Goals at Discharge (Final  Review):   Goals and Risk Factor Review - 12/18/22 1048       Core Components/Risk Factors/Patient Goals Review   Personal Goals Review Weight Management/Obesity;Improve shortness of breath with ADL's    Review Goal progressing for weight loss. Andrew [name] is working with our dietician for weight loss goal. Goal progressing on improving her shortness of breath with ADLs. Goal met on developing more efficient breathing techniques such as purse lipped breathing and diaphragmatic breathing; and practicing self-pacing with activity. Andrew [name] is able to demonstrate purse lip breathing when he gets SOB. He also paces himself when walking on the treadmill. Goal met for increasing knowledge of respiratory medications and ability to use respiratory devices properly. He has demonstrated proper technique when using his MDI to the respiratory therapist.    Expected Outcomes For Andrew [name] to lose weight and improve his shortness of breath with ADL's.             ITP Comments: Pt is making expected progress toward Pulmonary Rehab goals after completing 6 sessions. Recommend continued exercise, life style modification, education, and utilization of breathing techniques to increase stamina and strength, while also decreasing shortness of breath with exertion.  Dr. Mechele Collin is Medical Director for Pulmonary Rehab at [facility].

## 2022-12-24 ENCOUNTER — Encounter (HOSPITAL_COMMUNITY)
Admission: RE | Admit: 2022-12-24 | Discharge: 2022-12-24 | Disposition: A | Discharge status: Home / Self Care | Payer: PPO | Source: Ambulatory Visit | Attending: Pulmonary Disease | Admitting: Pulmonary Disease

## 2022-12-24 DIAGNOSIS — J449 Chronic obstructive pulmonary disease, unspecified: Secondary | ICD-10-CM

## 2022-12-24 NOTE — Progress Notes (Signed)
Daily Session Note  Patient Details  Name: Andrew [name] MRN: [id number] Date of Birth: 1955/10/09 Referring Provider:   Doristine Devoid Pulmonary Rehab Walk Test from 11/27/2022 in [facility] for Heart, Vascular, & Lung Health  Referring Provider [name]       Encounter Date: 12/24/2022  Check In:  Session Check In - 12/24/22 1135       Check-In   Supervising physician immediately available to respond to emergencies [facility] MD immediately available    Physician(s) Joni Reining, NP    Location [facility]    Staff Present Essie Hart, RN, Doris Cheadle, MS, ACSM-CEP, Exercise Physiologist;Casey Erin Sons BS, ACSM-CEP, Exercise Physiologist    Virtual Visit No    Medication changes reported     No    Fall or balance concerns reported    No    Tobacco Cessation No Change    Warm-up and Cool-down Performed as group-led instruction    Resistance Training Performed Yes    VAD Patient? No    PAD/SET Patient? No      Pain Assessment   Currently in Pain? No/denies    Multiple Pain Sites No             Capillary Blood Glucose: No results found for this or any previous visit (from the past 24 hour(s)).    Social History   Tobacco Use  Smoking Status Former   Current packs/day: 0.00   Average packs/day: 0.5 packs/day for 40.0 years (20.0 ttl pk-yrs)   Types: Cigarettes   Start date: 03/23/1978   Quit date: 03/23/2018   Years since quitting: 4.7  Smokeless Tobacco Never    Goals Met:  Proper associated with RPD/PD & O2 Sat Independence with exercise equipment Exercise tolerated well No report of concerns or symptoms today Strength training completed today  Goals Unmet:  Not Applicable  Comments: Service time is from 1008 to 1154.    Dr. Mechele Collin is Medical Director for Pulmonary Rehab at [facility].

## 2022-12-29 ENCOUNTER — Encounter (HOSPITAL_COMMUNITY)
Admission: RE | Admit: 2022-12-29 | Discharge: 2022-12-29 | Disposition: A | Discharge status: Home / Self Care | Payer: PPO | Source: Ambulatory Visit | Attending: Pulmonary Disease | Admitting: Pulmonary Disease

## 2022-12-29 DIAGNOSIS — J449 Chronic obstructive pulmonary disease, unspecified: Secondary | ICD-10-CM

## 2022-12-29 NOTE — Progress Notes (Signed)
Daily Session Note  Patient Details  Name: Andrew [name] MRN: [id number] Date of Birth: June 22, 1955 Referring Provider:   Doristine Devoid Pulmonary Rehab Walk Test from 11/27/2022 in [facility] for Heart, Vascular, & Lung Health  Referring Provider [name]       Encounter Date: 12/29/2022  Check In:  Session Check In - 12/29/22 1105       Check-In   Supervising physician immediately available to respond to emergencies [facility] immediately available    Physician(s) Joni Reining, NP    Location [facility]    Staff Present Raford Pitcher, MS, ACSM-CEP, Exercise Physiologist;Casey Erin Sons BS, ACSM-CEP, Exercise Physiologist    Virtual Visit No    Medication changes reported     No    Fall or balance concerns reported    No    Tobacco Cessation No Change    Warm-up and Cool-down Performed as group-led instruction    Resistance Training Performed Yes    VAD Patient? No    PAD/SET Patient? No      Pain Assessment   Currently in Pain? No/denies    Multiple Pain Sites No             Capillary Blood Glucose: No results found for this or any previous visit (from the past 24 hour(s)).    Social History   Tobacco Use  Smoking Status Former   Current packs/day: 0.00   Average packs/day: 0.5 packs/day for 40.0 years (20.0 ttl pk-yrs)   Types: Cigarettes   Start date: 03/23/1978   Quit date: 03/23/2018   Years since quitting: 4.7  Smokeless Tobacco Never    Goals Met:  Proper associated with RPD/PD & O2 Sat Independence with exercise equipment Exercise tolerated well No report of concerns or symptoms today Strength training completed today  Goals Unmet:  Not Applicable  Comments: Service time is from 1005 to 1133.    Dr. Mechele Collin is Medical Director for Pulmonary Rehab at [facility].

## 2022-12-31 ENCOUNTER — Encounter (HOSPITAL_COMMUNITY)
Admission: RE | Admit: 2022-12-31 | Discharge: 2022-12-31 | Disposition: A | Discharge status: Home / Self Care | Payer: PPO | Source: Ambulatory Visit | Attending: Pulmonary Disease | Admitting: Pulmonary Disease

## 2022-12-31 DIAGNOSIS — J449 Chronic obstructive pulmonary disease, unspecified: Secondary | ICD-10-CM | POA: Diagnosis not present

## 2022-12-31 NOTE — Progress Notes (Signed)
Daily Session Note  Patient Details  Name: Andrew [name] MRN: [id number] Date of Birth: [birth year] Referring Provider:   Doristine Devoid Pulmonary Rehab Walk Test from 11/27/2022 in [facility] for Heart, Vascular, & Lung Health  Referring Provider [name]       Encounter Date: 12/31/2022  Check In:  Session Check In - 12/31/22 1050       Check-In   Supervising physician immediately available to respond to emergencies [facility] immediately available    Physician(s) Jari Favre, NP    [facility]    Staff Present Raford Pitcher, MS, ACSM-CEP, Exercise Physiologist;Casey Erin Sons BS, ACSM-CEP, Exercise Physiologist;Taji Barretto Gerre Scull, RN, BSN;Samantha Belarus, RD, LDN    Virtual Visit No    Medication changes reported     No    Fall or balance concerns reported    No    Tobacco Cessation No Change    Warm-up and Cool-down Performed as group-led instruction    Resistance Training Performed Yes    VAD Patient? No    PAD/SET Patient? No      Pain Assessment   Currently in Pain? No/denies    Multiple Pain Sites No             Capillary Blood Glucose: No results found for this or any previous visit (from the past 24 hour(s)).    Social History   Tobacco Use  Smoking Status Former   Current packs/day: 0.00   Average packs/day: 0.5 packs/day for 40.0 years (20.0 ttl pk-yrs)   Types: Cigarettes   Start date: 03/23/1978   Quit date: 03/23/2018   Years since quitting: 4.7  Smokeless Tobacco Never    Goals Met:  Independence with exercise equipment Exercise tolerated well No report of concerns or symptoms today Strength training completed today  Goals Unmet:  Not Applicable  Comments: Service time is from 1006 to 1140    Dr. Mechele Collin is Medical Director for Pulmonary Rehab at [facility].

## 2023-01-05 ENCOUNTER — Encounter (HOSPITAL_COMMUNITY)
Admission: RE | Admit: 2023-01-05 | Discharge: 2023-01-05 | Disposition: A | Discharge status: Home / Self Care | Payer: PPO | Source: Ambulatory Visit | Attending: Pulmonary Disease | Admitting: Pulmonary Disease

## 2023-01-05 ENCOUNTER — Encounter (HOSPITAL_COMMUNITY): Payer: Self-pay

## 2023-01-05 VITALS — Wt 245.2 lb

## 2023-01-05 DIAGNOSIS — J449 Chronic obstructive pulmonary disease, unspecified: Secondary | ICD-10-CM | POA: Diagnosis not present

## 2023-01-05 NOTE — Progress Notes (Signed)
Daily Session Note  Patient Details  Name: Andrew [name] MRN: [id number] Date of Birth: 1956/01/17 Referring Provider:   Doristine Devoid Pulmonary Rehab Walk Test from 11/27/2022 in [facility] for Heart, Vascular, & Lung Health  Referring Provider [name]       Encounter Date: 01/05/2023  Check In:  Session Check In - 01/05/23 1118       Check-In   Supervising physician immediately available to respond to emergencies [facility] immediately available    Physician(s) Jari Favre, NP    Location [facility]    Staff Present Durel Salts, RT;Randi Idelle Crouch BS, ACSM-CEP, Exercise Physiologist;Shirlie Enck Gerre Scull, RN, BSN;Samantha Belarus, RD, LDN;Johnny Hale Bogus, MS, Exercise Physiologist    Virtual Visit No    Medication changes reported     No    Fall or balance concerns reported    No    Tobacco Cessation No Change    Warm-up and Cool-down Performed as group-led instruction    Resistance Training Performed Yes    VAD Patient? No    PAD/SET Patient? No      Pain Assessment   Currently in Pain? No/denies    Multiple Pain Sites No             Capillary Blood Glucose: No results found for this or any previous visit (from the past 24 hour(s)).   Exercise Prescription Changes - 01/05/23 1200       Response to Exercise   Blood Pressure (Admit) 92/56    Blood Pressure (Exercise) 142/58    Blood Pressure (Exit) 86/50    Heart Rate (Admit) 99 bpm    Heart Rate (Exercise) 127 bpm    Heart Rate (Exit) 104 bpm    Oxygen Saturation (Admit) 94 %    Oxygen Saturation (Exercise) 93 %    Oxygen Saturation (Exit) 94 %    Rating of Perceived Exertion (Exercise) 13    Perceived Dyspnea (Exercise) 2    Duration Continue with 30 min of aerobic exercise without signs/symptoms of physical distress.    Intensity THRR unchanged      Progression   Progression Continue to progress workloads to maintain intensity without signs/symptoms of physical distress.       Resistance Training   Training Prescription Yes    Weight black bands    Reps 10-15    Time 10 Minutes      Treadmill   MPH 2.4    Grade 1    Minutes 15    METs 3.17      Bike   Level 2.4    Minutes 15    METs 2.6             Social History   Tobacco Use  Smoking Status Former   Current packs/day: 0.00   Average packs/day: 0.5 packs/day for 40.0 years (20.0 ttl pk-yrs)   Types: Cigarettes   Start date: 03/23/1978   Quit date: 03/23/2018   Years since quitting: 4.7  Smokeless Tobacco Never    Goals Met:  Independence with exercise equipment Exercise tolerated well No report of concerns or symptoms today Strength training completed today  Goals Unmet:  Not Applicable  Comments: Service time is from 1008 to 1140  Dr. Mechele Collin is Medical Director for Pulmonary Rehab at [facility].

## 2023-01-07 ENCOUNTER — Encounter (HOSPITAL_COMMUNITY)
Admission: RE | Admit: 2023-01-07 | Discharge: 2023-01-07 | Disposition: A | Discharge status: Home / Self Care | Payer: PPO | Source: Ambulatory Visit | Attending: Pulmonary Disease | Admitting: Pulmonary Disease

## 2023-01-07 DIAGNOSIS — J449 Chronic obstructive pulmonary disease, unspecified: Secondary | ICD-10-CM

## 2023-01-07 NOTE — Progress Notes (Signed)
Daily Session Note  Patient Details  Name: Andrew [name] MRN: [id number] Date of Birth: November 17, 1955 Referring Provider:   Doristine Devoid Pulmonary Rehab Walk Test from 11/27/2022 in [facility] for Heart, Vascular, & Lung Health  Referring Provider [name]       Encounter Date: 01/07/2023  Check In:  Session Check In - 01/07/23 1050       Check-In   Supervising physician immediately available to respond to emergencies [facility] immediately available    Physician(s) Jari Favre, NP    [facility]    Staff Present Durel Salts, RT;Randi Idelle Crouch BS, ACSM-CEP, Exercise Physiologist;Mary Gerre Scull, RN, BSN;Samantha Belarus, RD, Gaylord Shih, MS, Exercise Physiologist;Kaylee Earlene Plater, MS, ACSM-CEP, Exercise Physiologist;Other   Genelle Bal (intern)   Virtual Visit No    Medication changes reported     No    Fall or balance concerns reported    No    Tobacco Cessation No Change    Warm-up and Cool-down Performed as group-led instruction    Resistance Training Performed Yes    VAD Patient? No    PAD/SET Patient? No      Pain Assessment   Currently in Pain? No/denies    Multiple Pain Sites No             Capillary Blood Glucose: No results found for this or any previous visit (from the past 24 hour(s)).    Social History   Tobacco Use  Smoking Status Former   Current packs/day: 0.00   Average packs/day: 0.5 packs/day for 40.0 years (20.0 ttl pk-yrs)   Types: Cigarettes   Start date: 03/23/1978   Quit date: 03/23/2018   Years since quitting: 4.7  Smokeless Tobacco Never    Goals Met:  Proper associated with RPD/PD & O2 Sat Independence with exercise equipment Exercise tolerated well No report of concerns or symptoms today Strength training completed today  Goals Unmet:  Not Applicable  Comments: Service time is from 1006 to 1140.  Dr. Mechele Collin is Medical Director for Pulmonary Rehab at [facility].

## 2023-01-12 ENCOUNTER — Encounter (HOSPITAL_COMMUNITY)
Admission: RE | Admit: 2023-01-12 | Discharge: 2023-01-12 | Disposition: A | Discharge status: Home / Self Care | Payer: PPO | Source: Ambulatory Visit | Attending: Pulmonary Disease | Admitting: Pulmonary Disease

## 2023-01-12 DIAGNOSIS — J449 Chronic obstructive pulmonary disease, unspecified: Secondary | ICD-10-CM | POA: Diagnosis not present

## 2023-01-12 NOTE — Progress Notes (Signed)
Daily Session Note  Patient Details  Name: Andrew [name] MRN: [id number] Date of Birth: May 31, 1955 Referring Provider:   Doristine Devoid Pulmonary Rehab Walk Test from 11/27/2022 in [facility] for Heart, Vascular, & Lung Health  Referring Provider [name]       Encounter Date: 01/12/2023  Check In:  Session Check In - 01/12/23 1053       Check-In   Supervising physician immediately available to respond to emergencies [facility] immediately available    Physician(s) Edd Fabian, NP    Location [facility]    Staff Present Durel Salts, RT;Randi Idelle Crouch BS, ACSM-CEP, Exercise Physiologist;Mary Gerre Scull, RN, BSN;Samantha Belarus, RD, Rexene Agent, MS, ACSM-CEP, Exercise Physiologist   Genelle Bal (intern)   Virtual Visit No    Medication changes reported     No    Fall or balance concerns reported    No    Tobacco Cessation No Change    Warm-up and Cool-down Performed as group-led instruction    Resistance Training Performed Yes    VAD Patient? No    PAD/SET Patient? No      Pain Assessment   Currently in Pain? No/denies    Multiple Pain Sites No             Capillary Blood Glucose: No results found for this or any previous visit (from the past 24 hour(s)).    Social History   Tobacco Use  Smoking Status Former   Current packs/day: 0.00   Average packs/day: 0.5 packs/day for 40.0 years (20.0 ttl pk-yrs)   Types: Cigarettes   Start date: 03/23/1978   Quit date: 03/23/2018   Years since quitting: 4.8  Smokeless Tobacco Never    Goals Met:  Proper associated with RPD/PD & O2 Sat Independence with exercise equipment Exercise tolerated well No report of concerns or symptoms today Strength training completed today  Goals Unmet:  Not Applicable  Comments: Service time is from 1008 to 1136.  Dr. Mechele Collin is Medical Director for Pulmonary Rehab at [facility].

## 2023-01-14 ENCOUNTER — Encounter (HOSPITAL_COMMUNITY)
Admission: RE | Admit: 2023-01-14 | Discharge: 2023-01-14 | Disposition: A | Discharge status: Home / Self Care | Payer: PPO | Source: Ambulatory Visit | Attending: Pulmonary Disease | Admitting: Pulmonary Disease

## 2023-01-14 DIAGNOSIS — J449 Chronic obstructive pulmonary disease, unspecified: Secondary | ICD-10-CM | POA: Diagnosis not present

## 2023-01-14 NOTE — Progress Notes (Signed)
Daily Session Note  Patient Details  Name: Andrew [name] MRN: [id number] Date of Birth: Aug 17, 1955 Referring Provider:   Doristine Devoid Pulmonary Rehab Walk Test from 11/27/2022 in [facility] for Heart, Vascular, & Lung Health  Referring Provider [name]       Encounter Date: 01/14/2023  Check In:  Session Check In - 01/14/23 1029       Check-In   Supervising physician immediately available to respond to emergencies [facility] MD immediately available    Physician(s) Eligha Bridegroom, NP    Location [facility]    Staff Present Durel Salts, RT;Randi Idelle Crouch BS, ACSM-CEP, Exercise Physiologist;Mary Gerre Scull, RN, BSN;Samantha Belarus, RD, Rexene Agent, MS, ACSM-CEP, Exercise Physiologist   Genelle Bal (intern)   Virtual Visit No    Medication changes reported     No    Fall or balance concerns reported    No    Tobacco Cessation No Change    Warm-up and Cool-down Performed as group-led instruction    Resistance Training Performed Yes    VAD Patient? No    PAD/SET Patient? No      Pain Assessment   Currently in Pain? No/denies    Multiple Pain Sites No             Capillary Blood Glucose: No results found for this or any previous visit (from the past 24 hour(s)).    Social History   Tobacco Use  Smoking Status Former   Current packs/day: 0.00   Average packs/day: 0.5 packs/day for 40.0 years (20.0 ttl pk-yrs)   Types: Cigarettes   Start date: 03/23/1978   Quit date: 03/23/2018   Years since quitting: 4.8  Smokeless Tobacco Never    Goals Met:  Proper associated with RPD/PD & O2 Sat Independence with exercise equipment Exercise tolerated well No report of concerns or symptoms today Strength training completed today  Goals Unmet:  Not Applicable  Comments: Service time is from 1007 to 1136.   Dr. Mechele Collin is Medical Director for Pulmonary Rehab at [facility].

## 2023-01-14 NOTE — Progress Notes (Signed)
Home Exercise Prescription I have reviewed a Home Exercise Prescription with Deanna Artis Torok. Annette Stable is currently walking his dog about 1 mile/day. I am unsure if this counts as exercise because the distance is an estimation. Annette Stable stated that he just purchased a gym membership to [employer]. I encouraged Bill to attend [employer] at least 1 non-rehab day/wk. Bill wants to walk on the treadmill and cycle on the stationary bike for 15 min each. I agreed with that time. Annette Stable seems very motivated to exercise and improve his functional capacity. The patient stated that their goals were to play golf. We reviewed exercise guidelines, target heart rate during exercise, RPE Scale, weather conditions, endpoints for exercise, warmup and cool down. The patient is encouraged to come to me with any questions. I will continue to follow up with the patient to assist them with progression and safety. Spent 15 min with patient discussing home exercise plan and goals  Joya San, MS, ACSM-CEP 01/14/2023 3:50 PM

## 2023-01-19 ENCOUNTER — Encounter (HOSPITAL_COMMUNITY)
Admission: RE | Admit: 2023-01-19 | Discharge: 2023-01-19 | Disposition: A | Discharge status: Home / Self Care | Payer: PPO | Source: Ambulatory Visit | Attending: Pulmonary Disease | Admitting: Pulmonary Disease

## 2023-01-19 VITALS — Wt 243.8 lb

## 2023-01-19 DIAGNOSIS — J449 Chronic obstructive pulmonary disease, unspecified: Secondary | ICD-10-CM | POA: Diagnosis not present

## 2023-01-19 NOTE — Progress Notes (Signed)
Daily Session Note  Patient Details  Name: Andrew [name] MRN: [id number] Date of Birth: [birth year] Referring Provider:   Doristine Devoid Pulmonary Rehab Walk Test from 11/27/2022 in [facility]  Referring Provider [name]       Encounter Date: 01/19/2023  Check In:  Session Check In - 01/19/23 1108       Check-In   Supervising physician immediately available to respond to emergencies [facility] MD immediately available    Physician(s) Robin Searing, NP    Location [facility]    Staff Present Durel Salts, Patriciaann Clan, RN, BSN;Samantha Belarus, RD, Rexene Agent, MS, ACSM-CEP, Exercise Physiologist;Annedrea Remus Loffler, RN, [facility]   Genelle Bal (intern)   Virtual Visit No    Medication changes reported     No    Fall or balance concerns reported    No    Tobacco Cessation No Change    Warm-up and Cool-down Performed as group-led instruction    Resistance Training Performed Yes    VAD Patient? No    PAD/SET Patient? No      Pain Assessment   Currently in Pain? No/denies    Multiple Pain Sites No             Capillary Blood Glucose: No results found for this or any previous visit (from the past 24 hour(s)).   Exercise Prescription Changes - 01/19/23 1200       Response to Exercise   Blood Pressure (Admit) 104/56    Blood Pressure (Exercise) 148/88    Blood Pressure (Exit) 96/56    Heart Rate (Admit) 106 bpm    Heart Rate (Exercise) 145 bpm    Heart Rate (Exit) 118 bpm    Oxygen Saturation (Admit) 93 %    Oxygen Saturation (Exercise) 93 %    Oxygen Saturation (Exit) 93 %    Rating of Perceived Exertion (Exercise) 12    Perceived Dyspnea (Exercise) 2    Duration Continue with 30 min of aerobic exercise without signs/symptoms of physical distress.    Intensity THRR unchanged      Progression   Progression Continue to progress workloads to maintain intensity without signs/symptoms of physical  distress.      Resistance Training   Training Prescription Yes    Weight black bands    Reps 10-15    Time 10 Minutes      Treadmill   MPH 2.6    Grade 1.5    Minutes 15    METs 3.53      Bike   Level 2.6    Minutes 15    METs 2.6             Social History   Tobacco Use  Smoking Status Former   Current packs/day: 0.00   Average packs/day: 0.5 packs/day for 40.0 years (20.0 ttl pk-yrs)   Types: Cigarettes   Start date: 03/23/1978   Quit date: 03/23/2018   Years since quitting: 4.8  Smokeless Tobacco Never    Goals Met:  Independence with exercise equipment Exercise tolerated well No report of concerns or symptoms today Strength training completed today  Goals Unmet:  Not Applicable  Comments: Service time is from 1005 to 1131  Dr. Mechele Collin is Medical Director for Pulmonary Rehab at [facility].

## 2023-01-20 NOTE — Progress Notes (Signed)
Pulmonary Individual Treatment Plan  Patient Details  Name: Andrew [name] MRN: [id number] Date of Birth: Jun 25, 1955 Referring Provider:   Doristine Devoid Pulmonary Rehab Walk Test from 11/27/2022 in [facility]  Referring Provider [name]       Initial Encounter Date:  Flowsheet Row Pulmonary Rehab Walk Test from 11/27/2022 in [facility]  Date 11/27/22       Visit Diagnosis: Stage 2 moderate COPD by GOLD classification (HCC)  Patient's Home Medications on Admission:   Current Outpatient Medications:    albuterol (VENTOLIN HFA) 108 (90 Base) MCG/ACT inhaler, SMARTSIG:2 Puff(s) By Mouth Every 4 Hours PRN, Disp: , Rfl:    amLODipine-benazepril (LOTREL) 5-10 MG capsule, 1 tablet, Disp: , Rfl:    atorvastatin (LIPITOR) 10 MG tablet, Take 10 mg by mouth daily., Disp: , Rfl:    BREZTRI AEROSPHERE 160-9-4.8 MCG/ACT AERO, SMARTSIG:2 Puff(s) By Mouth Twice Daily, Disp: , Rfl:    ibuprofen (ADVIL) 200 MG tablet, 2 tablets with food or milk as needed, Disp: , Rfl:    Multiple Vitamin (MULTI VITAMIN) TABS, 1 tablet, Disp: , Rfl:    zolpidem (AMBIEN) 5 MG tablet, Take 5 mg by mouth at bedtime as needed for sleep., Disp: , Rfl:   Past Medical History: Past Medical History:  Diagnosis Date   Hyperlipidemia    Hypertension     Tobacco Use: Social History   Tobacco Use  Smoking Status Former   Current packs/day: 0.00   Average packs/day: 0.5 packs/day for 40.0 years (20.0 ttl pk-yrs)   Types: Cigarettes   Start date: 03/23/1978   Quit date: 03/23/2018   Years since quitting: 4.8  Smokeless Tobacco Never    Labs: Review Flowsheet       Latest Ref Rng & Units 06/16/2010  Labs for ITP Cardiac and Pulmonary Rehab  Cholestrol 0 - 200 mg/dL 629   LDL (calc) 0 - 99 mg/dL 528   HDL-C >41 mg/dL 41   Trlycerides <324 mg/dL 401     Details            Capillary Blood  Glucose: No results found for: "GLUCAP"   Pulmonary Assessment Scores:  Pulmonary Assessment Scores     Row Name 11/27/22 1051         ADL UCSD   ADL Phase Entry     SOB Score total 25       CAT Score   CAT Score 13       mMRC Score   mMRC Score 2             UCSD: Self-administered rating of dyspnea associated with activities of daily living (ADLs) 6-point scale (0 = "not at all" to 5 = "maximal or unable to do because of breathlessness")  Scoring Scores range from 0 to 120.  Minimally important difference is 5 units  CAT: CAT can identify the health impairment of COPD patients and is better correlated with disease progression.  CAT has a scoring range of zero to 40. The CAT score is classified into four groups of low (less than 10), medium (10 - 20), high (21-30) and very high (31-40) based on the impact level of disease on health status. A CAT score over 10 suggests significant symptoms.  A worsening CAT score could be explained by an exacerbation, poor medication adherence, poor inhaler technique, or progression of COPD or comorbid conditions.  CAT MCID is 2 points  mMRC: mMRC (Modified Medical Research Council) Dyspnea Scale is used to assess the degree of baseline functional disability in patients of respiratory disease due to dyspnea. No minimal important difference is established. A decrease in score of 1 point or greater is considered a positive change.   Pulmonary Function Assessment:  Pulmonary Function Assessment - 11/27/22 1049       Breath   Bilateral Breath Sounds Decreased    Shortness of Breath Yes;Limiting activity             Exercise Target Goals: Exercise Program Goal: Individual exercise prescription set using results from initial 6 min walk test and THRR while considering  patient's activity barriers and safety.   Exercise Prescription Goal: Initial exercise prescription builds to 30-45 minutes a day of aerobic activity, 2-3 days per  week.  Home exercise guidelines will be given to patient during program as part of exercise prescription that the participant will acknowledge.  Activity Barriers & Risk Stratification:  Activity Barriers & Cardiac Risk Stratification - 11/27/22 1059       Activity Barriers & Cardiac Risk Stratification   Activity Barriers Deconditioning;Muscular Weakness;Shortness of Breath             6 Minute Walk:  6 Minute Walk     Row Name 11/27/22 1150         6 Minute Walk   Phase Initial     Distance 1297 feet     Walk Time 6 minutes     # of Rest Breaks 0     MPH 2.46     METS 2.93     RPE 11     Perceived Dyspnea  1     VO2 Peak 10.24     Symptoms No     Resting HR 107 bpm     Resting BP 138/70     Resting Oxygen Saturation  95 %     Exercise Oxygen Saturation  during 6 min walk 92 %     Max Ex. HR 116 bpm     Max Ex. BP 160/60     2 Minute Post BP 136/66       Interval HR   1 Minute HR 111     2 Minute HR 113     3 Minute HR 113     4 Minute HR 116     5 Minute HR 116     6 Minute HR 116     2 Minute Post HR 108     Interval Heart Rate? Yes       Interval Oxygen   Interval Oxygen? Yes     Baseline Oxygen Saturation % 95 %     1 Minute Oxygen Saturation % 93 %     1 Minute Liters of Oxygen 0 L     2 Minute Oxygen Saturation % 92 %     2 Minute Liters of Oxygen 0 L     3 Minute Oxygen Saturation % 92 %     3 Minute Liters of Oxygen 0 L     4 Minute Oxygen Saturation % 93 %     4 Minute Liters of Oxygen 0 L     5 Minute Oxygen Saturation % 92 %     5 Minute Liters of Oxygen 0 L     6 Minute Oxygen Saturation % 92 %     6 Minute Liters of Oxygen 0 L  2 Minute Post Oxygen Saturation % 95 %     2 Minute Post Liters of Oxygen 0 L              Oxygen Initial Assessment:  Oxygen Initial Assessment - 11/27/22 1049       Home Oxygen   Home Oxygen Device None    Sleep Oxygen Prescription None    Home Exercise Oxygen Prescription None    Home  Resting Oxygen Prescription None      Initial 6 min Walk   Oxygen Used None      Program Oxygen Prescription   Program Oxygen Prescription None      Intervention   Short Term Goals To learn and understand importance of maintaining oxygen saturations>88%;To learn and demonstrate proper pursed lip breathing techniques or other breathing techniques. ;To learn and understand importance of monitoring SPO2 with pulse oximeter and demonstrate accurate use of the pulse oximeter.;To learn and demonstrate proper use of respiratory medications    Long  Term Goals Maintenance of O2 saturations>88%;Compliance with respiratory medication;Verbalizes importance of monitoring SPO2 with pulse oximeter and return demonstration;Exhibits proper breathing techniques, such as pursed lip breathing or other method taught during program session;Demonstrates proper use of MDI's             Oxygen Re-Evaluation:  Oxygen Re-Evaluation     Row Name 12/16/22 0707 01/11/23 1037           Program Oxygen Prescription   Program Oxygen Prescription None None        Home Oxygen   Home Oxygen Device None None      Sleep Oxygen Prescription None None      Home Exercise Oxygen Prescription None None      Home Resting Oxygen Prescription None None        Goals/Expected Outcomes   Short Term Goals To learn and understand importance of maintaining oxygen saturations>88%;To learn and demonstrate proper pursed lip breathing techniques or other breathing techniques. ;To learn and understand importance of monitoring SPO2 with pulse oximeter and demonstrate accurate use of the pulse oximeter.;To learn and demonstrate proper use of respiratory medications To learn and understand importance of maintaining oxygen saturations>88%;To learn and demonstrate proper pursed lip breathing techniques or other breathing techniques. ;To learn and understand importance of monitoring SPO2 with pulse oximeter and demonstrate accurate use of the  pulse oximeter.;To learn and demonstrate proper use of respiratory medications      Long  Term Goals Maintenance of O2 saturations>88%;Compliance with respiratory medication;Verbalizes importance of monitoring SPO2 with pulse oximeter and return demonstration;Exhibits proper breathing techniques, such as pursed lip breathing or other method taught during program session;Demonstrates proper use of MDI's Maintenance of O2 saturations>88%;Compliance with respiratory medication;Verbalizes importance of monitoring SPO2 with pulse oximeter and return demonstration;Exhibits proper breathing techniques, such as pursed lip breathing or other method taught during program session;Demonstrates proper use of MDI's      Goals/Expected Outcomes Compliance and understanding of oxygen saturation monitoring and breathing techniques to decrease shortness of breath. Compliance and understanding of oxygen saturation monitoring and breathing techniques to decrease shortness of breath.               Oxygen Discharge (Final Oxygen Re-Evaluation):  Oxygen Re-Evaluation - 01/11/23 1037       Program Oxygen Prescription   Program Oxygen Prescription None      Home Oxygen   Home Oxygen Device None    Sleep Oxygen Prescription None    Home Exercise  Oxygen Prescription None    Home Resting Oxygen Prescription None      Goals/Expected Outcomes   Short Term Goals To learn and understand importance of maintaining oxygen saturations>88%;To learn and demonstrate proper pursed lip breathing techniques or other breathing techniques. ;To learn and understand importance of monitoring SPO2 with pulse oximeter and demonstrate accurate use of the pulse oximeter.;To learn and demonstrate proper use of respiratory medications    Long  Term Goals Maintenance of O2 saturations>88%;Compliance with respiratory medication;Verbalizes importance of monitoring SPO2 with pulse oximeter and return demonstration;Exhibits proper breathing  techniques, such as pursed lip breathing or other method taught during program session;Demonstrates proper use of MDI's    Goals/Expected Outcomes Compliance and understanding of oxygen saturation monitoring and breathing techniques to decrease shortness of breath.             Initial Exercise Prescription:  Initial Exercise Prescription - 11/27/22 1100       Date of Initial Exercise RX and Referring Provider   Date 11/27/22    Referring Provider [name]    Expected Discharge Date 02/16/23      Treadmill   MPH 2    Grade 0    Minutes 15    METs 2.54      Bike   Level 1    Watts 42    Minutes 15    METs 2.4      Prescription Details   Frequency (times per week) 2    Duration Progress to 30 minutes of continuous aerobic without signs/symptoms of physical distress      Intensity   THRR 40-80% of Max Heartrate 60-121    Ratings of Perceived Exertion 11-13    Perceived Dyspnea 0-4      Progression   Progression Continue to progress workloads to maintain intensity without signs/symptoms of physical distress.      Resistance Training   Training Prescription Yes    Weight blue bands    Reps 10-15             Perform Capillary Blood Glucose checks as needed.  Exercise Prescription Changes:   Exercise Prescription Changes     Row Name 12/08/22 1100 12/22/22 1100 01/05/23 1200 01/14/23 1500 01/19/23 1200     Response to Exercise   Blood Pressure (Admit) 100/62 132/66 92/56 -- 104/56   Blood Pressure (Exercise) 132/70 160/58 142/58 -- 148/88   Blood Pressure (Exit) 106/70 132/60 86/50 -- 96/56   Heart Rate (Admit) 104 bpm 99 bpm 99 bpm -- 106 bpm   Heart Rate (Exercise) 133 bpm 132 bpm 127 bpm -- 145 bpm   Heart Rate (Exit) 106 bpm 106 bpm 104 bpm -- 118 bpm   Oxygen Saturation (Admit) 95 % 94 % 94 % -- 93 %   Oxygen Saturation (Exercise) 93 % 91 % 93 % -- 93 %   Oxygen Saturation (Exit) 94 % 93 % 94 % -- 93 %   Rating of Perceived Exertion (Exercise) 13 13  13  -- 12   Perceived Dyspnea (Exercise) 2 2 2  -- 2   Duration Continue with 30 min of aerobic exercise without signs/symptoms of physical distress. Continue with 30 min of aerobic exercise without signs/symptoms of physical distress. Continue with 30 min of aerobic exercise without signs/symptoms of physical distress. -- Continue with 30 min of aerobic exercise without signs/symptoms of physical distress.   Intensity THRR unchanged THRR New THRR unchanged -- THRR unchanged     Progression   Progression  Continue to progress workloads to maintain intensity without signs/symptoms of physical distress. Continue to progress workloads to maintain intensity without signs/symptoms of physical distress. Continue to progress workloads to maintain intensity without signs/symptoms of physical distress. -- Continue to progress workloads to maintain intensity without signs/symptoms of physical distress.     Resistance Training   Training Prescription Yes Yes Yes -- Yes   Weight blue bands blue bands black bands -- black bands   Reps 10-15 10-15 10-15 -- 10-15   Time 10 Minutes 10 Minutes 10 Minutes -- 10 Minutes     Treadmill   MPH 2 2.2 2.4 -- 2.6   Grade 0 0 1 -- 1.5   Minutes 15 15 15  -- 15   METs 1.8 2.5 3.17 -- 3.53     Bike   Level 2 2.2 2.4 -- 2.6   Minutes 15 15 15  -- 15   METs 2.4 2.6 2.6 -- 2.6     Home Exercise Plan   Plans to continue exercise at -- -- -- Home (comment) --   Frequency -- -- -- Add 1 additional day to program exercise sessions. --   Initial Home Exercises Provided -- -- -- 01/14/23 --            Exercise Comments:   Exercise Comments     Row Name 12/03/22 1219 01/14/23 1539         Exercise Comments Pt completed first day of group exercise. He exercised on the treadmill for 15 min, speed 2.0, incline 0, METs 2.53. His HR got to 155. He then exercised on the scifit for 15 min, level 2, METs 2.4. HR 141. Will progress as tolerated. He performed warm up and cool  down without limitations. Discussed METs with good reception. He is motivated. Completed home ExRx. Annette Stable is currently walking his dog about 1 mile/day. I am unsure if this counts as exercise because the distance is an estimation. Annette Stable stated that he just purchased a gym membership to [employer]. I encouraged Bill to attend [employer] at least 1 non-rehab day/wk. Bill wants to walk on the treadmill and cycle on the stationary bike for 15 min each. I agreed with that time. Annette Stable seems very motivated to exercise and improve his functional capacity.               Exercise Goals and Review:   Exercise Goals     Row Name 11/27/22 1100             Exercise Goals   Increase Physical Activity Yes       Intervention Provide advice, education, support and counseling about physical activity/exercise needs.;Develop an individualized exercise prescription for aerobic and resistive training based on initial evaluation findings, risk stratification, comorbidities and participant's personal goals.       Expected Outcomes Long Term: Exercising regularly at least 3-5 days a week.;Long Term: Add in home exercise to make exercise part of routine and to increase amount of physical activity.;Short Term: Attend rehab on a regular basis to increase amount of physical activity.       Increase Strength and Stamina Yes       Intervention Provide advice, education, support and counseling about physical activity/exercise needs.;Develop an individualized exercise prescription for aerobic and resistive training based on initial evaluation findings, risk stratification, comorbidities and participant's personal goals.       Expected Outcomes Short Term: Increase workloads from initial exercise prescription for resistance, speed, and METs.;Short Term: Perform  resistance training exercises routinely during rehab and add in resistance training at home;Long Term: Improve cardiorespiratory fitness, muscular endurance and  strength as measured by increased METs and functional capacity ( )       Able to understand and use rate of perceived exertion (RPE) scale Yes       Intervention Provide education and explanation on how to use RPE scale       Expected Outcomes Short Term: Able to use RPE daily in rehab to express subjective intensity level;Long Term:  Able to use RPE to guide intensity level when exercising independently       Able to understand and use Dyspnea scale Yes       Intervention Provide education and explanation on how to use Dyspnea scale       Expected Outcomes Short Term: Able to use Dyspnea scale daily in rehab to express subjective sense of shortness of breath during exertion;Long Term: Able to use Dyspnea scale to guide intensity level when exercising independently       Knowledge and understanding of Target Heart Rate Range (THRR) Yes       Intervention Provide education and explanation of THRR including how the numbers were predicted and where they are located for reference       Expected Outcomes Short Term: Able to state/look up THRR;Short Term: Able to use daily as guideline for intensity in rehab;Long Term: Able to use THRR to govern intensity when exercising independently       Understanding of Exercise Prescription Yes       Intervention Provide education, explanation, and written materials on patient's individual exercise prescription       Expected Outcomes Short Term: Able to explain program exercise prescription;Long Term: Able to explain home exercise prescription to exercise independently                Exercise Goals Re-Evaluation :  Exercise Goals Re-Evaluation     Row Name 12/16/22 0653 01/11/23 1034           Exercise Goal Re-Evaluation   Exercise Goals Review Increase Physical Activity;Increase Strength and Stamina;Able to understand and use rate of perceived exertion (RPE) scale;Able to understand and use Dyspnea scale;Knowledge and understanding of Target Heart  Rate Range (THRR);Understanding of Exercise Prescription Increase Physical Activity;Increase Strength and Stamina;Able to understand and use rate of perceived exertion (RPE) scale;Able to understand and use Dyspnea scale;Knowledge and understanding of Target Heart Rate Range (THRR);Understanding of Exercise Prescription      Comments Annette Stable has completed 4 exercise sessions. He exercises for 15 min on the treadmill and upright bike. He averages 2.54 METs at 2 mph and 0 incline on the treadmill and 2.4 METs at level 2 on the upright bike. Bill performs the warmup and cooldown standing without limitations. He has increased his workload slightly on the treadmill. This is due to him tolerating the treadmill better. He level has remained the same on the upright bike. It is too soon to notate any discernable progressions. Will continue to monitor and progress as able. Annette Stable has completed 11 exercise sessions. He exercises for 15 min on the treadmill and upright bike. He averages 2.84 METs at 2.4 mph and 0 incline on the treadmill and 2.6 METs at level 2.4 on the upright bike. Bill performs the warmup and cooldown standing without limitations. He has increased his workload and METs for the treadmill and upright bike. He tolerates progressions well. Annette Stable seems motivated to exercise and improve his functional  capacity. He understands his ExRx well and knows how to increase his workloads as staff have encouraged consistent progressions. Will continue to monitor and progress as able.      Expected Outcomes Through exercise at rehab and home, the patient will decrease shortness of breath with daily activities and feel confident in carrying out an exercise regimen at home. Through exercise at rehab and home, the patient will decrease shortness of breath with daily activities and feel confident in carrying out an exercise regimen at home.               Discharge Exercise Prescription (Final Exercise Prescription  Changes):  Exercise Prescription Changes - 01/19/23 1200       Response to Exercise   Blood Pressure (Admit) 104/56    Blood Pressure (Exercise) 148/88    Blood Pressure (Exit) 96/56    Heart Rate (Admit) 106 bpm    Heart Rate (Exercise) 145 bpm    Heart Rate (Exit) 118 bpm    Oxygen Saturation (Admit) 93 %    Oxygen Saturation (Exercise) 93 %    Oxygen Saturation (Exit) 93 %    Rating of Perceived Exertion (Exercise) 12    Perceived Dyspnea (Exercise) 2    Duration Continue with 30 min of aerobic exercise without signs/symptoms of physical distress.    Intensity THRR unchanged      Progression   Progression Continue to progress workloads to maintain intensity without signs/symptoms of physical distress.      Resistance Training   Training Prescription Yes    Weight black bands    Reps 10-15    Time 10 Minutes      Treadmill   MPH 2.6    Grade 1.5    Minutes 15    METs 3.53      Bike   Level 2.6    Minutes 15    METs 2.6             Nutrition:  Target Goals: Understanding of nutrition guidelines, daily intake of sodium 1500mg , cholesterol 200mg , calories 30% from fat and 7% or less from saturated fats, daily to have 5 or more servings of fruits and vegetables.  Biometrics:  Pre Biometrics - 11/27/22 1043       Pre Biometrics   Grip Strength 45 kg              Nutrition Therapy Plan and Nutrition Goals:  Nutrition Therapy & Goals - 12/31/22 1122       Nutrition Therapy   Diet Heart Healthy Diet    Drug/Food Interactions Statins/Certain Fruits      Personal Nutrition Goals   Nutrition Goal Patient to improve diet quality by using the plate method as a guide for meal planning to include lean protein/plant protein, fruits, vegetables, whole grains, nonfat dairy as part of a well-balanced diet.   goal in progress.   Personal Goal #2 Patient to identify strategies or weight loss of 0.5-2.0# per week.   goal in progress.   Comments Goals in  progress. Bill reports motivation to lose weight. He eats two meals daily and enjoys some cooking and convenience type cooking using frozen veggies, etc. He continues to limit sodium in his diet. We have discussed multiple strategies for weight loss including reducing calories from high carb/high fat meals, increase low calorie dense foods, using the plate method for planning, etc. Annette Stable is down 4.6# since starting with our program per orientation weight. Patient will benefit from participation  in pulmonary rehab for nutrition, exercise, and lifestyle modification.      Intervention Plan   Intervention Prescribe, educate and counsel regarding individualized specific dietary modifications aiming towards targeted core components such as weight, hypertension, lipid management, diabetes, heart failure and other comorbidities.;Nutrition handout(s) given to patient.    Expected Outcomes Short Term Goal: Understand basic principles of dietary content, such as calories, fat, sodium, cholesterol and nutrients.;Long Term Goal: Adherence to prescribed nutrition plan.;Short Term Goal: A plan has been developed with personal nutrition goals set during dietitian appointment.             Nutrition Assessments:  Nutrition Assessments - 12/03/22 1452       Rate Your Plate Scores   Pre Score 42            MEDIFICTS Score Key: >=70 Need to make dietary changes  40-70 Heart Healthy Diet <= 40 Therapeutic Level Cholesterol Diet  Flowsheet Row PULMONARY REHAB CHRONIC OBSTRUCTIVE PULMONARY DISEASE from 12/03/2022 in [facility]  Picture Your Plate Total Score on Admission 42      Picture Your Plate Scores: <47 Unhealthy dietary pattern with much room for improvement. 41-50 Dietary pattern unlikely to meet recommendations for good health and room for improvement. 51-60 More healthful dietary pattern, with some room for improvement.  >60 Healthy  dietary pattern, although there may be some specific behaviors that could be improved.    Nutrition Goals Re-Evaluation:  Nutrition Goals Re-Evaluation     Row Name 12/03/22 1143 12/31/22 1122           Goals   Current Weight 247 lb 12.8 oz (112.4 kg) 247 lb 2.2 oz (112.1 kg)      Comment triglycerides 184, LDL 101 no new labs; most recent labs  triglycerides 184, LDL 101      Expected Outcome Bill reports motivation to lose weight. He eats two meals daily and enjoys some cooking and convenience type cooking using frozen veggies, etc. He continues to limit sodium in his diet. Patient will benefit from participation in pulmonary rehab for nutrition, exercise, and lifestyle modification. Goals in progress. Bill reports motivation to lose weight. He eats two meals daily and enjoys some cooking and convenience type cooking using frozen veggies, etc. He continues to limit sodium in his diet. We have discussed multiple strategies for weight loss including reducing calories from high carb/high fat meals, increase low calorie dense foods, using the plate method for planning, etc. Annette Stable is down 4.6# since starting with our program per orientation weight. Patient will benefit from participation in pulmonary rehab for nutrition, exercise, and lifestyle modification.               Nutrition Goals Discharge (Final Nutrition Goals Re-Evaluation):  Nutrition Goals Re-Evaluation - 12/31/22 1122       Goals   Current Weight 247 lb 2.2 oz (112.1 kg)    Comment no new labs; most recent labs  triglycerides 184, LDL 101    Expected Outcome Goals in progress. Bill reports motivation to lose weight. He eats two meals daily and enjoys some cooking and convenience type cooking using frozen veggies, etc. He continues to limit sodium in his diet. We have discussed multiple strategies for weight loss including reducing calories from high carb/high fat meals, increase low calorie dense foods, using the plate method  for planning, etc. Annette Stable is down 4.6# since starting with our program per orientation weight. Patient will  benefit from participation in pulmonary rehab for nutrition, exercise, and lifestyle modification.             Psychosocial: Target Goals: Acknowledge presence or absence of significant depression and/or stress, maximize coping skills, provide positive support system. Participant is able to verbalize types and ability to use techniques and skills needed for reducing stress and depression.  Initial Review & Psychosocial Screening:  Initial Psych Review & Screening - 11/27/22 1056       Initial Review   Current issues with None Identified      Family Dynamics   Good Support System? Yes    Concerns --   none   Comments Pt denies concerns      Barriers   Psychosocial barriers to participate in program There are no identifiable barriers or psychosocial needs.      Screening Interventions   Interventions Encouraged to exercise    Expected Outcomes Long Term Goal: Stressors or current issues are controlled or eliminated.             Quality of Life Scores:  Scores of 19 and below usually indicate a poorer quality of life in these areas.  A difference of  2-3 points is a clinically meaningful difference.  A difference of 2-3 points in the total score of the Quality of Life Index has been associated with significant improvement in overall quality of life, self-image, physical symptoms, and general health in studies assessing change in quality of life.  PHQ-9: Review Flowsheet       11/27/2022  Depression screen PHQ 2/9  Decreased Interest 1  Down, Depressed, Hopeless 0  PHQ - 2 Score 1  Altered sleeping 1  Tired, decreased energy 1  Change in appetite 1  Feeling bad or failure about yourself  0  Trouble concentrating 0  Moving slowly or fidgety/restless 0  Suicidal thoughts 0  PHQ-9 Score 4  Difficult doing work/chores Not difficult at all    Details            Interpretation of Total Score  Total Score Depression Severity:  1-4 = Minimal depression, 5-9 = Mild depression, 10-14 = Moderate depression, 15-19 = Moderately severe depression, 20-27 = Severe depression   Psychosocial Evaluation and Intervention:  Psychosocial Evaluation - 11/27/22 1057       Psychosocial Evaluation & Interventions   Interventions Encouraged to exercise with the program and follow exercise prescription    Comments Everardo denies any psychosocial barriers or concerns    Expected Outcomes For Abhijay to participate in PR without any barriers or concerns    Continue Psychosocial Services  No Follow up required             Psychosocial Re-Evaluation:  Psychosocial Re-Evaluation     Row Name 12/18/22 1047 01/11/23 1333           Psychosocial Re-Evaluation   Current issues with None Identified None Identified      Comments Bill denies any new psychosocial barriers or concerns at this time. Annette Stable continues to deny any psychosocial barriers or concerns at this time.      Expected Outcomes For Bill to participate in PR free of any psychosocial barriers or concerns For Bill to participate in PR free of any psychosocial barriers or concerns      Interventions Encouraged to attend Pulmonary Rehabilitation for the exercise Encouraged to attend Pulmonary Rehabilitation for the exercise      Continue Psychosocial Services  No Follow up required No  Follow up required               Psychosocial Discharge (Final Psychosocial Re-Evaluation):  Psychosocial Re-Evaluation - 01/11/23 1333       Psychosocial Re-Evaluation   Current issues with None Identified    Comments Annette Stable continues to deny any psychosocial barriers or concerns at this time.    Expected Outcomes For Bill to participate in PR free of any psychosocial barriers or concerns    Interventions Encouraged to attend Pulmonary Rehabilitation for the exercise    Continue Psychosocial Services  No Follow up  required             Education: Education Goals: Education classes will be provided on a weekly basis, covering required topics. Participant will state understanding/return demonstration of topics presented.  Learning Barriers/Preferences:  Learning Barriers/Preferences - 11/27/22 1223       Learning Barriers/Preferences   Learning Barriers None    Learning Preferences Group Instruction;Verbal Instruction;Written Material             Education Topics: Know Your Numbers Group instruction that is supported by a PowerPoint presentation. Instructor discusses importance of knowing and understanding resting, exercise, and post-exercise oxygen saturation, heart rate, and blood pressure. Oxygen saturation, heart rate, blood pressure, rating of perceived exertion, and dyspnea are reviewed along with a normal range for these values.  Flowsheet Row PULMONARY REHAB CHRONIC OBSTRUCTIVE PULMONARY DISEASE from 12/24/2022 in [facility]  Date 12/24/22  Educator EP  Instruction Review Code 1- Verbalizes Understanding       Exercise for the Pulmonary Patient Group instruction that is supported by a PowerPoint presentation. Instructor discusses benefits of exercise, core components of exercise, frequency, duration, and intensity of an exercise routine, importance of utilizing pulse oximetry during exercise, safety while exercising, and options of places to exercise outside of rehab.  Flowsheet Row PULMONARY REHAB CHRONIC OBSTRUCTIVE PULMONARY DISEASE from 12/17/2022 in [facility], Vascular, [facility] Lung [facility]  Date 12/17/22  Educator EP  Instruction Review Code 1- Verbalizes Understanding       MET Level  Group instruction provided by PowerPoint, verbal discussion, and written material to support subject matter. Instructor reviews what METs are and how to increase METs.    Pulmonary  Medications Verbally interactive group education provided by instructor with focus on inhaled medications and proper administration. Flowsheet Row PULMONARY REHAB CHRONIC OBSTRUCTIVE PULMONARY DISEASE from 12/10/2022 in [facility] for Heart, Vascular, & Lung Health  Date 12/10/22  Educator RT  Instruction Review Code 1- Verbalizes Understanding       Anatomy and Physiology of the Respiratory System Group instruction provided by PowerPoint, verbal discussion, and written material to support subject matter. Instructor reviews respiratory cycle and anatomical components of the respiratory system and their functions. Instructor also reviews differences in obstructive and restrictive respiratory diseases with examples of each.  Flowsheet Row PULMONARY REHAB CHRONIC OBSTRUCTIVE PULMONARY DISEASE from 12/03/2022 in [facility] Vascular, [facility] Lung [facility]  Date 12/03/22  Educator RT  Instruction Review Code 1- Verbalizes Understanding       Oxygen Safety Group instruction provided by PowerPoint, verbal discussion, and written material to support subject matter. There is an overview of "What is Oxygen" and "Why do we need it".  Instructor also reviews how to create a safe environment for oxygen use, the importance of using oxygen as prescribed, and the risks  of noncompliance. There is a brief discussion on traveling with oxygen and resources the patient may utilize. Flowsheet Row PULMONARY REHAB CHRONIC OBSTRUCTIVE PULMONARY DISEASE from 12/31/2022 in [facility] for [facility], Vascular, & Lung [facility]  Date 12/31/22  Educator RN  Instruction Review Code 1- Verbalizes Understanding       Oxygen Use Group instruction provided by PowerPoint, verbal discussion, and written material to discuss how supplemental oxygen is prescribed and different types of oxygen supply systems. Resources for more information are provided.   Flowsheet Row PULMONARY REHAB CHRONIC OBSTRUCTIVE PULMONARY DISEASE from 01/07/2023 in [facility] for Heart, Vascular, & Lung Health  Date 01/07/23  Educator RT  Instruction Review Code 1- Verbalizes Understanding       Breathing Techniques Group instruction that is supported by demonstration and informational handouts. Instructor discusses the benefits of pursed lip and diaphragmatic breathing and detailed demonstration on how to perform both.  Flowsheet Row PULMONARY REHAB CHRONIC OBSTRUCTIVE PULMONARY DISEASE from 01/14/2023 in [facility] for Heart, Vascular, & Lung Health  Date 01/14/23  Educator RN  Instruction Review Code 1- Verbalizes Understanding        Risk Factor Reduction Group instruction that is supported by a PowerPoint presentation. Instructor discusses the definition of a risk factor, different risk factors for pulmonary disease, and how the heart and lungs work together.   Pulmonary Diseases Group instruction provided by PowerPoint, verbal discussion, and written material to support subject matter. Instructor gives an overview of the different type of pulmonary diseases. There is also a discussion on risk factors and symptoms as well as ways to manage the diseases.   Stress and Energy Conservation Group instruction provided by PowerPoint, verbal discussion, and written material to support subject matter. Instructor gives an overview of stress and the impact it can have on the body. Instructor also reviews ways to reduce stress. There is also a discussion on energy conservation and ways to conserve energy throughout the day.   Warning Signs and Symptoms Group instruction provided by PowerPoint, verbal discussion, and written material to support subject matter. Instructor reviews warning signs and symptoms of stroke, heart attack, cold and flu. Instructor also reviews ways to prevent the spread of infection.   Other  Education Group or individual verbal, written, or video instructions that support the educational goals of the pulmonary rehab program.    Knowledge Questionnaire Score:  Knowledge Questionnaire Score - 11/27/22 1223       Knowledge Questionnaire Score   Pre Score 17/18             Core Components/Risk Factors/Patient Goals at Admission:  Personal Goals and Risk Factors at Admission - 11/27/22 1058       Core Components/Risk Factors/Patient Goals on Admission    Weight Management Yes    Intervention Weight Management/Obesity: Establish reasonable short term and long term weight goals.;Weight Management: Provide education and appropriate resources to help participant work on and attain dietary goals.;Weight Management: Develop a combined nutrition and exercise program designed to reach desired caloric intake, while maintaining appropriate intake of nutrient and fiber, sodium and fats, and appropriate energy expenditure required for the weight goal.;Obesity: Provide education and appropriate resources to help participant work on and attain dietary goals.    Admit Weight 251 lb 12.3 oz (114.2 kg)    Goal Weight: Short Term 241 lb (109.3 kg)    Goal Weight: Long Term 220 lb (99.8 kg)    Expected  Outcomes Short Term: Continue to assess and modify interventions until short term weight is achieved;Long Term: Adherence to nutrition and physical activity/exercise program aimed toward attainment of established weight goal;Weight Loss: Understanding of general recommendations for a balanced deficit meal plan, which promotes 1-2 lb weight loss per week and includes a negative energy balance of [id number] kcal/d;Understanding of distribution of calorie intake throughout the day with the consumption of 4-5 meals/snacks;Understanding recommendations for meals to include 15-35% energy as protein, 25-35% energy from fat, 35-60% energy from carbohydrates, less than 200mg  of dietary cholesterol, 20-35 gm of  total fiber daily    Improve shortness of breath with ADL's Yes    Intervention Provide education, individualized exercise plan and daily activity instruction to help decrease symptoms of SOB with activities of daily living.    Expected Outcomes Short Term: Improve cardiorespiratory fitness to achieve a reduction of symptoms when performing ADLs;Long Term: Be able to perform more ADLs without symptoms or delay the onset of symptoms    Increase knowledge of respiratory medications and ability to use respiratory devices properly  Yes    Intervention Provide education and demonstration as needed of appropriate use of medications, inhalers, and oxygen therapy.    Expected Outcomes Short Term: Achieves understanding of medications use. Understands that oxygen is a medication prescribed by physician. Demonstrates appropriate use of inhaler and oxygen therapy.;Long Term: Maintain appropriate use of medications, inhalers, and oxygen therapy.             Core Components/Risk Factors/Patient Goals Review:   Goals and Risk Factor Review     Row Name 12/18/22 1048 01/11/23 1334 01/11/23 1335         Core Components/Risk Factors/Patient Goals Review   Personal Goals Review Weight Management/Obesity;Improve shortness of breath with ADL's Weight Management/Obesity Weight Management/Obesity;Improve shortness of breath with ADL's     Review Goal progressing for weight loss. Annette Stable is working with our dietician for weight loss goal. Goal progressing on improving her shortness of breath with ADLs. Goal met on developing more efficient breathing techniques such as purse lipped breathing and diaphragmatic breathing; and practicing self-pacing with activity. Annette Stable is able to demonstrate purse lip breathing when he gets SOB. He also paces himself when walking on the treadmill. Goal met for increasing knowledge of respiratory medications and ability to use respiratory devices properly. He has demonstrated proper  technique when using his MDI to the respiratory therapist. -- Goal progressing for weight loss. Annette Stable is working with our dietician for weight loss goal. he is currently down 4.6#'s since starting the program. Goal progressing on improving her shortness of breath with ADLs. Annette Stable has been able to increase his workload and MET's on the bike. He has also been able to increase his speed and incline on the treadmill. We will continue to monitor Bill's progress throughout the program.     Expected Outcomes For Bill to lose weight and improve his shortness of breath with ADL's. -- For Bill to lose weight and improve his shortness of breath with ADL's.              Core Components/Risk Factors/Patient Goals at Discharge (Final Review):   Goals and Risk Factor Review - 01/11/23 1335       Core Components/Risk Factors/Patient Goals Review   Personal Goals Review Weight Management/Obesity;Improve shortness of breath with ADL's    Review Goal progressing for weight loss. Annette Stable is working with our dietician for weight loss goal. he is currently down 4.6#'s since  starting the program. Goal progressing on improving her shortness of breath with ADLs. Annette Stable has been able to increase his workload and MET's on the bike. He has also been able to increase his speed and incline on the treadmill. We will continue to monitor Bill's progress throughout the program.    Expected Outcomes For Bill to lose weight and improve his shortness of breath with ADL's.             ITP Comments: Pt is making expected progress toward Pulmonary Rehab goals after completing 14 session(s). Recommend continued exercise, life style modification, education, and utilization of breathing techniques to increase stamina and strength, while also decreasing shortness of breath with exertion.  Dr. Mechele Collin is Medical Director for Pulmonary Rehab at [facility].

## 2023-01-21 ENCOUNTER — Encounter (HOSPITAL_COMMUNITY)
Admission: RE | Admit: 2023-01-21 | Discharge: 2023-01-21 | Disposition: A | Discharge status: Home / Self Care | Payer: PPO | Source: Ambulatory Visit | Attending: Pulmonary Disease | Admitting: Pulmonary Disease

## 2023-01-21 DIAGNOSIS — J449 Chronic obstructive pulmonary disease, unspecified: Secondary | ICD-10-CM | POA: Diagnosis not present

## 2023-01-21 NOTE — Progress Notes (Signed)
Daily Session Note  Patient Details  Name: Andrew [name] MRN: [id number] Date of Birth: August 27, 1955 Referring Provider:   Doristine Devoid Pulmonary Rehab Walk Test from 11/27/2022 in [facility] for Heart, Vascular, & Lung Health  Referring Provider [name]       Encounter Date: 01/21/2023  Check In:  Session Check In - 01/21/23 1033       Check-In   Supervising physician immediately available to respond to emergencies [facility] MD immediately available    Physician(s) Carlyon Shadow, NP    Location [facility]    Staff Present Durel Salts, Patriciaann Clan, RN, BSN;Samantha Belarus, RD, Rexene Agent, MS, ACSM-CEP, Exercise Physiologist;Annedrea Remus Loffler, RN, MHA;David Makemson, MS, ACSM-CEP, CCRP, Exercise Physiologist   Genelle Bal (intern)   Virtual Visit No    Medication changes reported     No    Fall or balance concerns reported    No    Tobacco Cessation No Change    Warm-up and Cool-down Performed as group-led instruction    Resistance Training Performed Yes    VAD Patient? No    PAD/SET Patient? No      Pain Assessment   Currently in Pain? No/denies    Multiple Pain Sites No             Capillary Blood Glucose: No results found for this or any previous visit (from the past 24 hour(s)).    Social History   Tobacco Use  Smoking Status Former   Current packs/day: 0.00   Average packs/day: 0.5 packs/day for 40.0 years (20.0 ttl pk-yrs)   Types: Cigarettes   Start date: 03/23/1978   Quit date: 03/23/2018   Years since quitting: 4.8  Smokeless Tobacco Never    Goals Met:  Proper associated with RPD/PD & O2 Sat Independence with exercise equipment Exercise tolerated well No report of concerns or symptoms today Strength training completed today  Goals Unmet:  Not Applicable  Comments: Service time is from 1008 to 1140.    Dr. Mechele Collin is Medical Director for Pulmonary Rehab at [facility].

## 2023-01-26 ENCOUNTER — Encounter (HOSPITAL_COMMUNITY)
Admission: RE | Admit: 2023-01-26 | Discharge: 2023-01-26 | Disposition: A | Discharge status: Home / Self Care | Payer: PPO | Source: Ambulatory Visit | Attending: Pulmonary Disease | Admitting: Pulmonary Disease

## 2023-01-26 DIAGNOSIS — J449 Chronic obstructive pulmonary disease, unspecified: Secondary | ICD-10-CM | POA: Diagnosis not present

## 2023-01-26 NOTE — Progress Notes (Signed)
Daily Session Note  Patient Details  Name: Andrew [name] MRN: [id number] Date of Birth: 1956-01-27 Referring Provider:   Doristine Devoid Pulmonary Rehab Walk Test from 11/27/2022 in [facility] for Heart, Vascular, & Lung Health  Referring Provider [name]       Encounter Date: 01/26/2023  Check In:  Session Check In - 01/26/23 1032       Check-In   Supervising physician immediately available to respond to emergencies [facility] MD immediately available    Physician(s) Carlyon Shadow, NP    Location [facility]    Staff Present Durel Salts, Patriciaann Clan, RN, Doris Cheadle, MS, ACSM-CEP, Exercise Physiologist;Barnes Florek Dionisio Paschal, ACSM-CEP, Exercise Physiologist   Genelle Bal (intern)   Virtual Visit No    Medication changes reported     No    Fall or balance concerns reported    No    Tobacco Cessation No Change    Warm-up and Cool-down Performed as group-led instruction    Resistance Training Performed Yes    VAD Patient? No    PAD/SET Patient? No      Pain Assessment   Currently in Pain? No/denies    Multiple Pain Sites No             Capillary Blood Glucose: No results found for this or any previous visit (from the past 24 hour(s)).    Social History   Tobacco Use  Smoking Status Former   Current packs/day: 0.00   Average packs/day: 0.5 packs/day for 40.0 years (20.0 ttl pk-yrs)   Types: Cigarettes   Start date: 03/23/1978   Quit date: 03/23/2018   Years since quitting: 4.8  Smokeless Tobacco Never    Goals Met:  Independence with exercise equipment Improved SOB with ADL's Exercise tolerated well No report of concerns or symptoms today Strength training completed today  Goals Unmet:  Not Applicable  Comments: Service time is from 1004 to 1126.    Dr. Mechele Collin is Medical Director for Pulmonary Rehab at [facility].

## 2023-01-28 ENCOUNTER — Encounter (HOSPITAL_COMMUNITY): Payer: PPO

## 2023-02-02 ENCOUNTER — Encounter (HOSPITAL_COMMUNITY)
Admission: RE | Admit: 2023-02-02 | Discharge: 2023-02-02 | Disposition: A | Discharge status: Home / Self Care | Payer: PPO | Source: Ambulatory Visit | Attending: Pulmonary Disease | Admitting: Pulmonary Disease

## 2023-02-02 VITALS — Wt 241.0 lb

## 2023-02-02 DIAGNOSIS — J449 Chronic obstructive pulmonary disease, unspecified: Secondary | ICD-10-CM

## 2023-02-02 NOTE — Progress Notes (Signed)
Daily Session Note  Patient Details  Name: Andrew [name] MRN: [id number] Date of Birth: October 18, 1955 Referring Provider:   Doristine Devoid Pulmonary Rehab Walk Test from 11/27/2022 in [facility] for Heart, Vascular, & Lung Health  Referring Provider [name]       Encounter Date: 02/02/2023  Check In:  Session Check In - 02/02/23 1028       Check-In   Supervising physician immediately available to respond to emergencies [facility] MD immediately available    Physician(s) Lorin Picket, NP    Location [facility]    Staff Present Durel Salts, Patriciaann Clan, RN, Doris Cheadle, MS, ACSM-CEP, Exercise Physiologist;Bekki Tavenner Dionisio Paschal, ACSM-CEP, Exercise Physiologist;Samantha Belarus, RD, LDN   Genelle Bal (intern)   Virtual Visit No    Medication changes reported     No    Fall or balance concerns reported    No    Tobacco Cessation No Change    Warm-up and Cool-down Performed as group-led instruction    Resistance Training Performed Yes    VAD Patient? No    PAD/SET Patient? No      Pain Assessment   Currently in Pain? No/denies    Multiple Pain Sites No             Capillary Blood Glucose: No results found for this or any previous visit (from the past 24 hour(s)).   Exercise Prescription Changes - 02/02/23 1100       Response to Exercise   Blood Pressure (Admit) 128/68    Blood Pressure (Exercise) 132/68    Blood Pressure (Exit) 110/60    Heart Rate (Admit) 99 bpm    Heart Rate (Exercise) 136 bpm    Heart Rate (Exit) 111 bpm    Oxygen Saturation (Admit) 95 %    Oxygen Saturation (Exercise) 92 %    Oxygen Saturation (Exit) 95 %    Rating of Perceived Exertion (Exercise) 12    Perceived Dyspnea (Exercise) 2    Duration Continue with 30 min of aerobic exercise without signs/symptoms of physical distress.    Intensity THRR New   60-160     Progression   Progression Continue to progress workloads to maintain intensity without  signs/symptoms of physical distress.      Resistance Training   Training Prescription Yes    Weight double black bands    Reps 10-15    Time 10 Minutes      Interval Training   Interval Training No      Treadmill   MPH 2.6    Grade 1.5    Minutes 15    METs 3.53      Bike   Level 2.6    Minutes 15    METs 2.6             Social History   Tobacco Use  Smoking Status Former   Current packs/day: 0.00   Average packs/day: 0.5 packs/day for 40.0 years (20.0 ttl pk-yrs)   Types: Cigarettes   Start date: 03/23/1978   Quit date: 03/23/2018   Years since quitting: 4.8  Smokeless Tobacco Never    Goals Met:  Independence with exercise equipment Improved SOB with ADL's Achieving weight loss Exercise tolerated well No report of concerns or symptoms today Strength training completed today  Goals Unmet:  Not Applicable  Comments: Service time is from 1004 to 1129.    Dr. Mechele Collin is Medical Director for Pulmonary Rehab at [facility].

## 2023-02-04 ENCOUNTER — Encounter (HOSPITAL_COMMUNITY)
Admission: RE | Admit: 2023-02-04 | Discharge: 2023-02-04 | Disposition: A | Discharge status: Home / Self Care | Payer: PPO | Source: Ambulatory Visit | Attending: Pulmonary Disease | Admitting: Pulmonary Disease

## 2023-02-04 DIAGNOSIS — J449 Chronic obstructive pulmonary disease, unspecified: Secondary | ICD-10-CM | POA: Diagnosis not present

## 2023-02-04 NOTE — Progress Notes (Signed)
Daily Session Note  Patient Details  Name: Andrew [name] MRN: [id number] Date of Birth: 04/03/1955 Referring Provider:   Doristine Devoid Pulmonary Rehab Walk Test from 11/27/2022 in [facility]  Referring Provider [name]       Encounter Date: 02/04/2023  Check In:  Session Check In - 02/04/23 1041       Check-In   Supervising physician immediately available to respond to emergencies [facility] MD immediately available    Physician(s) Bernadene Person, NP    Location [facility]    Staff Present Durel Salts, Patriciaann Clan, RN, Doris Cheadle, MS, ACSM-CEP, Exercise Physiologist;Randi Dionisio Paschal, ACSM-CEP, Exercise Physiologist   Genelle Bal (intern)   Virtual Visit No    Medication changes reported     No    Fall or balance concerns reported    No    Tobacco Cessation No Change    Warm-up and Cool-down Performed as group-led instruction    Resistance Training Performed Yes    VAD Patient? No    PAD/SET Patient? No      Pain Assessment   Currently in Pain? No/denies    Multiple Pain Sites No             Capillary Blood Glucose: No results found for this or any previous visit (from the past 24 hour(s)).    Social History   Tobacco Use  Smoking Status Former   Current packs/day: 0.00   Average packs/day: 0.5 packs/day for 40.0 years (20.0 ttl pk-yrs)   Types: Cigarettes   Start date: 03/23/1978   Quit date: 03/23/2018   Years since quitting: 4.8  Smokeless Tobacco Never    Goals Met:  Proper associated with RPD/PD & O2 Sat Independence with exercise equipment Exercise tolerated well No report of concerns or symptoms today Strength training completed today  Goals Unmet:  Not Applicable  Comments: Service time is from 1005 to 1132.    Dr. Mechele Collin is Medical Director for Pulmonary Rehab at [facility].

## 2023-02-09 ENCOUNTER — Encounter (HOSPITAL_COMMUNITY)
Admission: RE | Admit: 2023-02-09 | Discharge: 2023-02-09 | Disposition: A | Discharge status: Home / Self Care | Payer: PPO | Source: Ambulatory Visit | Attending: Pulmonary Disease | Admitting: Pulmonary Disease

## 2023-02-09 DIAGNOSIS — J449 Chronic obstructive pulmonary disease, unspecified: Secondary | ICD-10-CM

## 2023-02-09 NOTE — Progress Notes (Signed)
Daily Session Note  Patient Details  Name: Andrew [name] MRN: [id number] Date of Birth: Dec 31, 1955 Referring Provider:   Doristine [name] Pulmonary Rehab Walk Test from 11/27/2022 in [facility], [facility], & [facility]  Referring Provider Andrew [name]       Encounter Date: 02/09/2023  Check In:  Session Check In - 02/09/23 1107       Check-In   Supervising physician immediately available to respond to emergencies [facility] MD immediately available    Physician(s) Andrew Person, NP    [facility]    Staff Present Andrew [name], Andrew Clan, RN, Andrew Cheadle, MS, ACSM-CEP, Exercise Physiologist;Andrew [name] BS, ACSM-CEP, Exercise Physiologist;Andrew [name], RD, LDN   Andrew [name] (intern)   Virtual Visit No    Medication changes reported     No    Fall or balance concerns reported    No    Tobacco Cessation No Change    Warm-up and Cool-down Performed as group-led instruction    Resistance Training Performed Yes    VAD Patient? No    PAD/SET Patient? No      Pain Assessment   Currently in Pain? No/denies    Multiple Pain Sites No             Capillary Blood Glucose: No results found for this or any previous visit (from the past 24 hour(s)).    Social History   Tobacco Use  Smoking Status Former   Current packs/day: 0.00   Average packs/day: 0.5 packs/day for 40.0 years (20.0 ttl pk-yrs)   Types: Cigarettes   Start date: 03/23/1978   Quit date: 03/23/2018   Years since quitting: 4.8  Smokeless Tobacco Never    Goals Met:  Proper associated with RPD/PD & O2 Sat Independence with exercise equipment Exercise tolerated well No report of concerns or symptoms today Strength training completed today  Goals Unmet:  Not Applicable  Comments: Service time is from 1005 to 1127.    Dr. Mechele Collin is Medical Director for Pulmonary Rehab at [facility].

## 2023-02-11 ENCOUNTER — Encounter: Payer: Self-pay | Admitting: Pulmonary Disease

## 2023-02-11 ENCOUNTER — Ambulatory Visit: Payer: PPO | Admitting: Pulmonary Disease

## 2023-02-11 ENCOUNTER — Encounter (HOSPITAL_COMMUNITY): Payer: PPO

## 2023-02-11 VITALS — BP 119/76 | HR 88 | Ht 71.0 in | Wt 239.0 lb

## 2023-02-11 DIAGNOSIS — J449 Chronic obstructive pulmonary disease, unspecified: Secondary | ICD-10-CM | POA: Diagnosis not present

## 2023-02-11 DIAGNOSIS — R9389 Abnormal findings on diagnostic imaging of other specified body structures: Secondary | ICD-10-CM | POA: Diagnosis not present

## 2023-02-11 NOTE — Patient Instructions (Addendum)
I will see you about 6 months from here  Repeat low-dose CT scan of the chest in about a year from now  Continue using Breztri  Call us with significant concerns   There is no contraindication or issues that I see with you traveling on a plane

## 2023-02-11 NOTE — Progress Notes (Signed)
Andrew [name]    [id number]    Aug 10, 1955  Primary Care Physician:Husain, Jerelyn Scott, MD  Referring Physician: Georgann Housekeeper, MD [address] [place],  [place] [address]  Chief complaint:   Patient being seen for shortness of breath on exertion, abnormal CT scan of the chest  HPI:  Breathing is overall better  Exercise in the gym regularly  Multiple lung nodules on CT scan reviewed on his recent CT which showed stability, groundglass changes at the base of the lung that improved  His breathing is overall better  [employer] well  Denies any chest pains or chest discomfort  Quit smoking 4 years ago  No pertinent occupational history  History of hypertension-well-controlled  Outpatient Encounter Medications as of 02/11/2023  Medication Sig   albuterol (VENTOLIN HFA) 108 (90 Base) MCG/ACT inhaler SMARTSIG:2 Puff(s) By Mouth Every 4 Hours PRN   amLODipine-benazepril (LOTREL) 5-10 MG capsule 1 tablet   atorvastatin (LIPITOR) 10 MG tablet Take 10 mg by mouth daily.   BREZTRI AEROSPHERE 160-9-4.8 MCG/ACT AERO SMARTSIG:2 Puff(s) By Mouth Twice Daily   ibuprofen (ADVIL) 200 MG tablet 2 tablets with food or milk as needed   Multiple Vitamin (MULTI VITAMIN) TABS 1 tablet   zolpidem (AMBIEN) 5 MG tablet Take 5 mg by mouth at bedtime as needed for sleep.   No facility-administered encounter medications on file as of 02/11/2023.    Allergies as of 02/11/2023   (No Known Allergies)    Past Medical History:  Diagnosis Date   Hyperlipidemia    Hypertension     Past Surgical History:  Procedure Laterality Date   CATARACT EXTRACTION     HYDROCELE EXCISION / REPAIR     TONSILLECTOMY      Family History  Problem Relation Age of Onset   Cancer Mother    Cancer Father     Social History   Socioeconomic History   Marital status: Divorced    Spouse name: Not on file   Number of children: 1   Years of education: 12   Highest education  level: Associate degree: occupational, Scientist, product/process development, or vocational program  Occupational History   Not on file  Tobacco Use   Smoking status: Former    Current packs/day: 0.00    Average packs/day: 0.5 packs/day for 40.0 years (20.0 ttl pk-yrs)    Types: Cigarettes    Start date: 03/23/1978    Quit date: 03/23/2018    Years since quitting: 4.8   Smokeless tobacco: Never  Vaping Use   Vaping status: Never Used  Substance and Sexual Activity   Alcohol use: Not Currently    Comment: stopped 4 years ago   Drug use: No   Sexual activity: Not on file  Other Topics Concern   Not on file  Social History Narrative   Like to read, listen to podcasts, watch movies, walk dog      Good support system   Social Determinants of Health   Financial Resource Strain: Not on file  Food Insecurity: Not on file  Transportation Needs: Not on file  Physical Activity: Not on file  Stress: Not on file  Social Connections: Not on file  Intimate Partner Violence: Not on file    Review of Systems  Constitutional:  Negative for fatigue.  Respiratory:  Positive for shortness of breath.   Psychiatric/Behavioral:  Negative for sleep disturbance.     Vitals:   02/11/23 0951  BP: 119/76  Pulse: 88  SpO2: 93%     Physical Exam Constitutional:      Appearance: Normal appearance. He is obese.  HENT:     Head: Normocephalic.     Mouth/Throat:     Mouth: Mucous membranes are moist.  Eyes:     Pupils: Pupils are equal, round, and reactive to light.  Cardiovascular:     Rate and Rhythm: Normal rate and regular rhythm.     Heart sounds: No murmur heard.    No friction rub.  Pulmonary:     Effort: No respiratory distress.     Breath sounds: No stridor. No rhonchi.  Musculoskeletal:     Cervical back: No rigidity or tenderness.  Neurological:     Mental Status: He is alert.  Psychiatric:        Mood and Affect: Mood normal.   Data Reviewed:  Most recent CT 12/09/2022 reviewed-clearing of left  upper lobe and left lower lobe nodularity.  Lung nodules remain stable Evidence of emphysema.  CT chest 08/22/2021 reviewed with the patient showing multiple lung nodules, evidence of emphysema  PFT was reviewed with the patient showing severe obstructive disease with an FEV1 of 49% with significant bronchodilator response  Assessment:  Chronic obstructive pulmonary disease -Remains stable -Compliant with Breztri  Shortness of breath -Appears stable  Multiple lung nodules -Stable from last CT  Reformed smoker -Encouraged to continue to stay off cigarettes  Plan/Recommendations: Continue Breztri  Low-dose CT scan of the chest in about a year  Follow-up in about 6 months  Encouraged to call with significant concerns  Virl Diamond MD [facility] 02/11/2023, 10:01 AM  CC: Georgann Housekeeper, MD

## 2023-02-16 ENCOUNTER — Encounter (HOSPITAL_COMMUNITY)
Admission: RE | Admit: 2023-02-16 | Discharge: 2023-02-16 | Disposition: A | Discharge status: Home / Self Care | Payer: PPO | Source: Ambulatory Visit | Attending: Pulmonary Disease

## 2023-02-16 VITALS — Wt 238.5 lb

## 2023-02-16 DIAGNOSIS — J449 Chronic obstructive pulmonary disease, unspecified: Secondary | ICD-10-CM

## 2023-02-16 NOTE — Progress Notes (Signed)
Daily Session Note  Patient Details  Name: Andrew [name] MRN: [id number] Date of Birth: August 21, 1955 Referring Provider:   Doristine Devoid Pulmonary Rehab Walk Test from 11/27/2022 in [facility]  Referring Provider [name]       Encounter Date: 02/16/2023  Check In:  Session Check In - 02/16/23 1025       Check-In   Supervising physician immediately available to respond to emergencies [facility] MD immediately available    Physician(s) Jari Favre, NP    [facility]    Staff Present Durel Salts, Patriciaann Clan, RN, Doris Cheadle, MS, ACSM-CEP, Exercise Physiologist;Randi Dionisio Paschal, ACSM-CEP, Exercise Physiologist   Genelle Bal (intern)   Virtual Visit No    Medication changes reported     No    Fall or balance concerns reported    No    Tobacco Cessation No Change    Warm-up and Cool-down Performed as group-led instruction    Resistance Training Performed Yes    VAD Patient? No    PAD/SET Patient? No      Pain Assessment   Currently in Pain? No/denies    Multiple Pain Sites No             Capillary Blood Glucose: No results found for this or any previous visit (from the past 24 hour(s)).   Exercise Prescription Changes - 02/16/23 1100       Response to Exercise   Blood Pressure (Admit) 110/60    Blood Pressure (Exercise) 150/64    Blood Pressure (Exit) 156/54    Heart Rate (Admit) 104 bpm    Heart Rate (Exercise) 143 bpm    Heart Rate (Exit) 117 bpm    Oxygen Saturation (Admit) 94 %    Oxygen Saturation (Exercise) 92 %    Oxygen Saturation (Exit) 94 %    Rating of Perceived Exertion (Exercise) 12    Perceived Dyspnea (Exercise) 2    Duration Continue with 30 min of aerobic exercise without signs/symptoms of physical distress.    Intensity THRR unchanged      Progression   Progression Continue to progress workloads to maintain intensity without signs/symptoms of physical distress.       Resistance Training   Training Prescription Yes    Weight double black bands    Reps 10-15    Time 10 Minutes      Treadmill   MPH 3    Grade 2    Minutes 15    METs 4      Bike   Level 3    Minutes 15    METs 3             Social History   Tobacco Use  Smoking Status Former   Current packs/day: 0.00   Average packs/day: 0.5 packs/day for 40.0 years (20.0 ttl pk-yrs)   Types: Cigarettes   Start date: 03/23/1978   Quit date: 03/23/2018   Years since quitting: 4.9  Smokeless Tobacco Never    Goals Met:  Proper associated with RPD/PD & O2 Sat Independence with exercise equipment Exercise tolerated well No report of concerns or symptoms today Strength training completed today  Goals Unmet:  Not Applicable  Comments: Service time is from 1008 to 1134.    Dr. Mechele Collin is Medical Director for Pulmonary Rehab at [facility].

## 2023-02-17 NOTE — Progress Notes (Signed)
Pulmonary Individual Treatment Plan  Patient Details  Name: Andrew [name] MRN: [id number] Date of Birth: 1955-07-03 Referring Provider:   Doristine Devoid Pulmonary Rehab Walk Test from 11/27/2022 in [facility]  Referring Provider [name]       Initial Encounter Date:  Flowsheet Row Pulmonary Rehab Walk Test from 11/27/2022 in [facility]  Date 11/27/22       Visit Diagnosis: Stage 2 moderate COPD by GOLD classification (HCC)  Patient's Home Medications on Admission:   Current Outpatient Medications:    albuterol (VENTOLIN HFA) 108 (90 Base) MCG/ACT inhaler, SMARTSIG:2 Puff(s) By Mouth Every 4 Hours PRN, Disp: , Rfl:    amLODipine-benazepril (LOTREL) 5-10 MG capsule, 1 tablet, Disp: , Rfl:    atorvastatin (LIPITOR) 10 MG tablet, Take 10 mg by mouth daily., Disp: , Rfl:    BREZTRI AEROSPHERE 160-9-4.8 MCG/ACT AERO, SMARTSIG:2 Puff(s) By Mouth Twice Daily, Disp: , Rfl:    ibuprofen (ADVIL) 200 MG tablet, 2 tablets with food or milk as needed, Disp: , Rfl:    Multiple Vitamin (MULTI VITAMIN) TABS, 1 tablet, Disp: , Rfl:    zolpidem (AMBIEN) 5 MG tablet, Take 5 mg by mouth at bedtime as needed for sleep., Disp: , Rfl:   Past Medical History: Past Medical History:  Diagnosis Date   Hyperlipidemia    Hypertension     Tobacco Use: Social History   Tobacco Use  Smoking Status Former   Current packs/day: 0.00   Average packs/day: 0.5 packs/day for 40.0 years (20.0 ttl pk-yrs)   Types: Cigarettes   Start date: 03/23/1978   Quit date: 03/23/2018   Years since quitting: 4.9  Smokeless Tobacco Never    Labs: Review Flowsheet       Latest Ref Rng & Units 06/16/2010  Labs for ITP Cardiac and Pulmonary Rehab  Cholestrol 0 - 200 mg/dL 782   LDL (calc) 0 - 99 mg/dL 956   HDL-C >21 mg/dL 41   Trlycerides <308 mg/dL 657     Details            Capillary Blood  Glucose: No results found for: "GLUCAP"   Pulmonary Assessment Scores:  Pulmonary Assessment Scores     Row Name 11/27/22 1051         ADL UCSD   ADL Phase Entry     SOB Score total 25       CAT Score   CAT Score 13       mMRC Score   mMRC Score 2             UCSD: Self-administered rating of dyspnea associated with activities of daily living (ADLs) 6-point scale (0 = "not at all" to 5 = "maximal or unable to do because of breathlessness")  Scoring Scores range from 0 to 120.  Minimally important difference is 5 units  CAT: CAT can identify the health impairment of COPD patients and is better correlated with disease progression.  CAT has a scoring range of zero to 40. The CAT score is classified into four groups of low (less than 10), medium (10 - 20), high (21-30) and very high (31-40) based on the impact level of disease on health status. A CAT score over 10 suggests significant symptoms.  A worsening CAT score could be explained by an exacerbation, poor medication adherence, poor inhaler technique, or progression of COPD or comorbid conditions.  CAT MCID is 2 points  mMRC: mMRC (Modified Medical Research Council) Dyspnea Scale is used to assess the degree of baseline functional disability in patients of respiratory disease due to dyspnea. No minimal important difference is established. A decrease in score of 1 point or greater is considered a positive change.   Pulmonary Function Assessment:  Pulmonary Function Assessment - 11/27/22 1049       Breath   Bilateral Breath Sounds Decreased    Shortness of Breath Yes;Limiting activity             Exercise Target Goals: Exercise Program Goal: Individual exercise prescription set using results from initial 6 min walk test and THRR while considering  patient's activity barriers and safety.   Exercise Prescription Goal: Initial exercise prescription builds to 30-45 minutes a day of aerobic activity, 2-3 days per  week.  Home exercise guidelines will be given to patient during program as part of exercise prescription that the participant will acknowledge.  Activity Barriers & Risk Stratification:  Activity Barriers & Cardiac Risk Stratification - 11/27/22 1059       Activity Barriers & Cardiac Risk Stratification   Activity Barriers Deconditioning;Muscular Weakness;Shortness of Breath             6 Minute Walk:  6 Minute Walk     Row Name 11/27/22 1150         6 Minute Walk   Phase Initial     Distance 1297 feet     Walk Time 6 minutes     # of Rest Breaks 0     MPH 2.46     METS 2.93     RPE 11     Perceived Dyspnea  1     VO2 Peak 10.24     Symptoms No     Resting HR 107 bpm     Resting BP 138/70     Resting Oxygen Saturation  95 %     Exercise Oxygen Saturation  during 6 min walk 92 %     Max Ex. HR 116 bpm     Max Ex. BP 160/60     2 Minute Post BP 136/66       Interval HR   1 Minute HR 111     2 Minute HR 113     3 Minute HR 113     4 Minute HR 116     5 Minute HR 116     6 Minute HR 116     2 Minute Post HR 108     Interval Heart Rate? Yes       Interval Oxygen   Interval Oxygen? Yes     Baseline Oxygen Saturation % 95 %     1 Minute Oxygen Saturation % 93 %     1 Minute Liters of Oxygen 0 L     2 Minute Oxygen Saturation % 92 %     2 Minute Liters of Oxygen 0 L     3 Minute Oxygen Saturation % 92 %     3 Minute Liters of Oxygen 0 L     4 Minute Oxygen Saturation % 93 %     4 Minute Liters of Oxygen 0 L     5 Minute Oxygen Saturation % 92 %     5 Minute Liters of Oxygen 0 L     6 Minute Oxygen Saturation % 92 %     6 Minute Liters of Oxygen 0 L  2 Minute Post Oxygen Saturation % 95 %     2 Minute Post Liters of Oxygen 0 L              Oxygen Initial Assessment:  Oxygen Initial Assessment - 11/27/22 1049       Home Oxygen   Home Oxygen Device None    Sleep Oxygen Prescription None    Home Exercise Oxygen Prescription None    Home  Resting Oxygen Prescription None      Initial 6 min Walk   Oxygen Used None      Program Oxygen Prescription   Program Oxygen Prescription None      Intervention   Short Term Goals To learn and understand importance of maintaining oxygen saturations>88%;To learn and demonstrate proper pursed lip breathing techniques or other breathing techniques. ;To learn and understand importance of monitoring SPO2 with pulse oximeter and demonstrate accurate use of the pulse oximeter.;To learn and demonstrate proper use of respiratory medications    Long  Term Goals Maintenance of O2 saturations>88%;Compliance with respiratory medication;Verbalizes importance of monitoring SPO2 with pulse oximeter and return demonstration;Exhibits proper breathing techniques, such as pursed lip breathing or other method taught during program session;Demonstrates proper use of MDI's             Oxygen Re-Evaluation:  Oxygen Re-Evaluation     Row Name 12/16/22 0707 01/11/23 1037 02/10/23 1123         Program Oxygen Prescription   Program Oxygen Prescription None None None       Home Oxygen   Home Oxygen Device None None None     Sleep Oxygen Prescription None None None     Home Exercise Oxygen Prescription None None None     Home Resting Oxygen Prescription None None None       Goals/Expected Outcomes   Short Term Goals To learn and understand importance of maintaining oxygen saturations>88%;To learn and demonstrate proper pursed lip breathing techniques or other breathing techniques. ;To learn and understand importance of monitoring SPO2 with pulse oximeter and demonstrate accurate use of the pulse oximeter.;To learn and demonstrate proper use of respiratory medications To learn and understand importance of maintaining oxygen saturations>88%;To learn and demonstrate proper pursed lip breathing techniques or other breathing techniques. ;To learn and understand importance of monitoring SPO2 with pulse oximeter and  demonstrate accurate use of the pulse oximeter.;To learn and demonstrate proper use of respiratory medications To learn and understand importance of maintaining oxygen saturations>88%;To learn and demonstrate proper pursed lip breathing techniques or other breathing techniques. ;To learn and understand importance of monitoring SPO2 with pulse oximeter and demonstrate accurate use of the pulse oximeter.;To learn and demonstrate proper use of respiratory medications     Long  Term Goals Maintenance of O2 saturations>88%;Compliance with respiratory medication;Verbalizes importance of monitoring SPO2 with pulse oximeter and return demonstration;Exhibits proper breathing techniques, such as pursed lip breathing or other method taught during program session;Demonstrates proper use of MDI's Maintenance of O2 saturations>88%;Compliance with respiratory medication;Verbalizes importance of monitoring SPO2 with pulse oximeter and return demonstration;Exhibits proper breathing techniques, such as pursed lip breathing or other method taught during program session;Demonstrates proper use of MDI's Maintenance of O2 saturations>88%;Compliance with respiratory medication;Verbalizes importance of monitoring SPO2 with pulse oximeter and return demonstration;Exhibits proper breathing techniques, such as pursed lip breathing or other method taught during program session;Demonstrates proper use of MDI's     Goals/Expected Outcomes Compliance and understanding of oxygen saturation monitoring and breathing techniques to decrease shortness  of breath. Compliance and understanding of oxygen saturation monitoring and breathing techniques to decrease shortness of breath. Compliance and understanding of oxygen saturation monitoring and breathing techniques to decrease shortness of breath.              Oxygen Discharge (Final Oxygen Re-Evaluation):  Oxygen Re-Evaluation - 02/10/23 1123       Program Oxygen Prescription   Program  Oxygen Prescription None      Home Oxygen   Home Oxygen Device None    Sleep Oxygen Prescription None    Home Exercise Oxygen Prescription None    Home Resting Oxygen Prescription None      Goals/Expected Outcomes   Short Term Goals To learn and understand importance of maintaining oxygen saturations>88%;To learn and demonstrate proper pursed lip breathing techniques or other breathing techniques. ;To learn and understand importance of monitoring SPO2 with pulse oximeter and demonstrate accurate use of the pulse oximeter.;To learn and demonstrate proper use of respiratory medications    Long  Term Goals Maintenance of O2 saturations>88%;Compliance with respiratory medication;Verbalizes importance of monitoring SPO2 with pulse oximeter and return demonstration;Exhibits proper breathing techniques, such as pursed lip breathing or other method taught during program session;Demonstrates proper use of MDI's    Goals/Expected Outcomes Compliance and understanding of oxygen saturation monitoring and breathing techniques to decrease shortness of breath.             Initial Exercise Prescription:  Initial Exercise Prescription - 11/27/22 1100       Date of Initial Exercise RX and Referring Provider   Date 11/27/22    Referring Provider [name]    Expected Discharge Date 02/16/23      Treadmill   MPH 2    Grade 0    Minutes 15    METs 2.54      Bike   Level 1    Watts 42    Minutes 15    METs 2.4      Prescription Details   Frequency (times per week) 2    Duration Progress to 30 minutes of continuous aerobic without signs/symptoms of physical distress      Intensity   THRR 40-80% of Max Heartrate 60-121    Ratings of Perceived Exertion 11-13    Perceived Dyspnea 0-4      Progression   Progression Continue to progress workloads to maintain intensity without signs/symptoms of physical distress.      Resistance Training   Training Prescription Yes    Weight blue bands     Reps 10-15             Perform Capillary Blood Glucose checks as needed.  Exercise Prescription Changes:   Exercise Prescription Changes     Row Name 12/08/22 1100 12/22/22 1100 01/05/23 1200 01/14/23 1500 01/19/23 1200     Response to Exercise   Blood Pressure (Admit) 100/62 132/66 92/56 -- 104/56   Blood Pressure (Exercise) 132/70 160/58 142/58 -- 148/88   Blood Pressure (Exit) 106/70 132/60 86/50 -- 96/56   Heart Rate (Admit) 104 bpm 99 bpm 99 bpm -- 106 bpm   Heart Rate (Exercise) 133 bpm 132 bpm 127 bpm -- 145 bpm   Heart Rate (Exit) 106 bpm 106 bpm 104 bpm -- 118 bpm   Oxygen Saturation (Admit) 95 % 94 % 94 % -- 93 %   Oxygen Saturation (Exercise) 93 % 91 % 93 % -- 93 %   Oxygen Saturation (Exit) 94 % 93 % 94 % --  93 %   Rating of Perceived Exertion (Exercise) 13 13 13  -- 12   Perceived Dyspnea (Exercise) 2 2 2  -- 2   Duration Continue with 30 min of aerobic exercise without signs/symptoms of physical distress. Continue with 30 min of aerobic exercise without signs/symptoms of physical distress. Continue with 30 min of aerobic exercise without signs/symptoms of physical distress. -- Continue with 30 min of aerobic exercise without signs/symptoms of physical distress.   Intensity THRR unchanged THRR New THRR unchanged -- THRR unchanged     Progression   Progression Continue to progress workloads to maintain intensity without signs/symptoms of physical distress. Continue to progress workloads to maintain intensity without signs/symptoms of physical distress. Continue to progress workloads to maintain intensity without signs/symptoms of physical distress. -- Continue to progress workloads to maintain intensity without signs/symptoms of physical distress.     Resistance Training   Training Prescription Yes Yes Yes -- Yes   Weight blue bands blue bands black bands -- black bands   Reps 10-15 10-15 10-15 -- 10-15   Time 10 Minutes 10 Minutes 10 Minutes -- 10 Minutes      Treadmill   MPH 2 2.2 2.4 -- 2.6   Grade 0 0 1 -- 1.5   Minutes 15 15 15  -- 15   METs 1.8 2.5 3.17 -- 3.53     Bike   Level 2 2.2 2.4 -- 2.6   Minutes 15 15 15  -- 15   METs 2.4 2.6 2.6 -- 2.6     Home Exercise Plan   Plans to continue exercise at -- -- -- Home (comment) --   Frequency -- -- -- Add 1 additional day to program exercise sessions. --   Initial Home Exercises Provided -- -- -- 01/14/23 --    Row Name 02/02/23 1100 02/16/23 1100           Response to Exercise   Blood Pressure (Admit) 128/68 110/60      Blood Pressure (Exercise) 132/68 150/64      Blood Pressure (Exit) 110/60 156/54      Heart Rate (Admit) 99 bpm 104 bpm      Heart Rate (Exercise) 136 bpm 143 bpm      Heart Rate (Exit) 111 bpm 117 bpm      Oxygen Saturation (Admit) 95 % 94 %      Oxygen Saturation (Exercise) 92 % 92 %      Oxygen Saturation (Exit) 95 % 94 %      Rating of Perceived Exertion (Exercise) 12 12      Perceived Dyspnea (Exercise) 2 2      Duration Continue with 30 min of aerobic exercise without signs/symptoms of physical distress. Continue with 30 min of aerobic exercise without signs/symptoms of physical distress.      Intensity THRR New  60-160 THRR unchanged        Progression   Progression Continue to progress workloads to maintain intensity without signs/symptoms of physical distress. Continue to progress workloads to maintain intensity without signs/symptoms of physical distress.        Resistance Training   Training Prescription Yes Yes      Weight double black bands double black bands      Reps 10-15 10-15      Time 10 Minutes 10 Minutes        Interval Training   Interval Training No --        Treadmill   MPH 2.6 3  Grade 1.5 2      Minutes 15 15      METs 3.53 4        Bike   Level 2.6 3      Minutes 15 15      METs 2.6 3               Exercise Comments:   Exercise Comments     Row Name 12/03/22 1219 01/14/23 1539         Exercise Comments Pt  completed first day of group exercise. He exercised on the treadmill for 15 min, speed 2.0, incline 0, METs 2.53. His HR got to 155. He then exercised on the scifit for 15 min, level 2, METs 2.4. HR 141. Will progress as tolerated. He performed warm up and cool down without limitations. Discussed METs with good reception. He is motivated. Completed home ExRx. Annette Stable is currently walking his dog about 1 mile/day. I am unsure if this counts as exercise because the distance is an estimation. Annette Stable stated that he just purchased a gym membership to [employer]. I encouraged Bill to attend [employer] at least 1 non-rehab day/wk. Bill wants to walk on the treadmill and cycle on the stationary bike for 15 min each. I agreed with that time. Annette Stable seems very motivated to exercise and improve his functional capacity.               Exercise Goals and Review:   Exercise Goals     Row Name 11/27/22 1100             Exercise Goals   Increase Physical Activity Yes       Intervention Provide advice, education, support and counseling about physical activity/exercise needs.;Develop an individualized exercise prescription for aerobic and resistive training based on initial evaluation findings, risk stratification, comorbidities and participant's personal goals.       Expected Outcomes Long Term: Exercising regularly at least 3-5 days a week.;Long Term: Add in home exercise to make exercise part of routine and to increase amount of physical activity.;Short Term: Attend rehab on a regular basis to increase amount of physical activity.       Increase Strength and Stamina Yes       Intervention Provide advice, education, support and counseling about physical activity/exercise needs.;Develop an individualized exercise prescription for aerobic and resistive training based on initial evaluation findings, risk stratification, comorbidities and participant's personal goals.       Expected Outcomes Short Term: Increase  workloads from initial exercise prescription for resistance, speed, and METs.;Short Term: Perform resistance training exercises routinely during rehab and add in resistance training at home;Long Term: Improve cardiorespiratory fitness, muscular endurance and strength as measured by increased METs and functional capacity ( )       Able to understand and use rate of perceived exertion (RPE) scale Yes       Intervention Provide education and explanation on how to use RPE scale       Expected Outcomes Short Term: Able to use RPE daily in rehab to express subjective intensity level;Long Term:  Able to use RPE to guide intensity level when exercising independently       Able to understand and use Dyspnea scale Yes       Intervention Provide education and explanation on how to use Dyspnea scale       Expected Outcomes Short Term: Able to use Dyspnea scale daily in rehab to express subjective sense of shortness of  breath during exertion;Long Term: Able to use Dyspnea scale to guide intensity level when exercising independently       Knowledge and understanding of Target Heart Rate Range (THRR) Yes       Intervention Provide education and explanation of THRR including how the numbers were predicted and where they are located for reference       Expected Outcomes Short Term: Able to state/look up THRR;Short Term: Able to use daily as guideline for intensity in rehab;Long Term: Able to use THRR to govern intensity when exercising independently       Understanding of Exercise Prescription Yes       Intervention Provide education, explanation, and written materials on patient's individual exercise prescription       Expected Outcomes Short Term: Able to explain program exercise prescription;Long Term: Able to explain home exercise prescription to exercise independently                Exercise Goals Re-Evaluation :  Exercise Goals Re-Evaluation     Row Name 12/16/22 0653 01/11/23 1034 02/10/23 1102          Exercise Goal Re-Evaluation   Exercise Goals Review Increase Physical Activity;Increase Strength and Stamina;Able to understand and use rate of perceived exertion (RPE) scale;Able to understand and use Dyspnea scale;Knowledge and understanding of Target Heart Rate Range (THRR);Understanding of Exercise Prescription Increase Physical Activity;Increase Strength and Stamina;Able to understand and use rate of perceived exertion (RPE) scale;Able to understand and use Dyspnea scale;Knowledge and understanding of Target Heart Rate Range (THRR);Understanding of Exercise Prescription Increase Physical Activity;Increase Strength and Stamina;Able to understand and use rate of perceived exertion (RPE) scale;Able to understand and use Dyspnea scale;Knowledge and understanding of Target Heart Rate Range (THRR);Understanding of Exercise Prescription     Comments Annette Stable has completed 4 exercise sessions. He exercises for 15 min on the treadmill and upright bike. He averages 2.54 METs at 2 mph and 0 incline on the treadmill and 2.4 METs at level 2 on the upright bike. Bill performs the warmup and cooldown standing without limitations. He has increased his workload slightly on the treadmill. This is due to him tolerating the treadmill better. He level has remained the same on the upright bike. It is too soon to notate any discernable progressions. Will continue to monitor and progress as able. Annette Stable has completed 11 exercise sessions. He exercises for 15 min on the treadmill and upright bike. He averages 2.84 METs at 2.4 mph and 0 incline on the treadmill and 2.6 METs at level 2.4 on the upright bike. Bill performs the warmup and cooldown standing without limitations. He has increased his workload and METs for the treadmill and upright bike. He tolerates progressions well. Annette Stable seems motivated to exercise and improve his functional capacity. He understands his ExRx well and knows how to increase his workloads as staff have  encouraged consistent progressions. Will continue to monitor and progress as able. Annette Stable has completed 19 exercise sessions. He exercises for 15 min on the treadmill and upright bike. He averages 3.7 METs at 2.8 mph and 2% incline on the treadmill and 2.9 METs at level 2.8 on the upright bike. Bill performs the warmup and cooldown standing without limitations. Annette Stable continues to increase his workloads for both exercise modes as METs have increased. He continues to tolerate progressions well. Annette Stable is still exercising outside of rehab. Will continue to monitor and progress as able.     Expected Outcomes Through exercise at rehab and home, the  patient will decrease shortness of breath with daily activities and feel confident in carrying out an exercise regimen at home. Through exercise at rehab and home, the patient will decrease shortness of breath with daily activities and feel confident in carrying out an exercise regimen at home. Through exercise at rehab and home, the patient will decrease shortness of breath with daily activities and feel confident in carrying out an exercise regimen at home.              Discharge Exercise Prescription (Final Exercise Prescription Changes):  Exercise Prescription Changes - 02/16/23 1100       Response to Exercise   Blood Pressure (Admit) 110/60    Blood Pressure (Exercise) 150/64    Blood Pressure (Exit) 156/54    Heart Rate (Admit) 104 bpm    Heart Rate (Exercise) 143 bpm    Heart Rate (Exit) 117 bpm    Oxygen Saturation (Admit) 94 %    Oxygen Saturation (Exercise) 92 %    Oxygen Saturation (Exit) 94 %    Rating of Perceived Exertion (Exercise) 12    Perceived Dyspnea (Exercise) 2    Duration Continue with 30 min of aerobic exercise without signs/symptoms of physical distress.    Intensity THRR unchanged      Progression   Progression Continue to progress workloads to maintain intensity without signs/symptoms of physical distress.      Resistance  Training   Training Prescription Yes    Weight double black bands    Reps 10-15    Time 10 Minutes      Treadmill   MPH 3    Grade 2    Minutes 15    METs 4      Bike   Level 3    Minutes 15    METs 3             Nutrition:  Target Goals: Understanding of nutrition guidelines, daily intake of sodium 1500mg , cholesterol 200mg , calories 30% from fat and 7% or less from saturated fats, daily to have 5 or more servings of fruits and vegetables.  Biometrics:  Pre Biometrics - 11/27/22 1043       Pre Biometrics   Grip Strength 45 kg              Nutrition Therapy Plan and Nutrition Goals:  Nutrition Therapy & Goals - 01/29/23 1049       Nutrition Therapy   Diet Heart Healthy Diet    Drug/Food Interactions Statins/Certain Fruits      Personal Nutrition Goals   Nutrition Goal Patient to improve diet quality by using the plate method as a guide for meal planning to include lean protein/plant protein, fruits, vegetables, whole grains, nonfat dairy as part of a well-balanced diet.   goal in progress.   Personal Goal #2 Patient to identify strategies or weight loss of 0.5-2.0# per week.   goal in progress.   Comments Goals in progress. Bill reports motivation to lose weight; he is down 8.8# since starting with our program (orientation weight was 114.2kg) .  We have discussed multiple strategies for weight loss including reducing calories from high carb/high fat meals, increase low calorie dense foods, using the plate method for planning, etc. He has started making many dietary changes including increased vegetable/ fruit intake, decreased portion sizes, and decreased fat/carbohydrate intake.Patient will benefit from participation in pulmonary rehab for nutrition, exercise, and lifestyle modification.      Intervention Plan   Intervention  Prescribe, educate and counsel regarding individualized specific dietary modifications aiming towards targeted core components such as  weight, hypertension, lipid management, diabetes, heart failure and other comorbidities.;Nutrition handout(s) given to patient.    Expected Outcomes Short Term Goal: Understand basic principles of dietary content, such as calories, fat, sodium, cholesterol and nutrients.;Long Term Goal: Adherence to prescribed nutrition plan.;Short Term Goal: A plan has been developed with personal nutrition goals set during dietitian appointment.             Nutrition Assessments:  Nutrition Assessments - 12/03/22 1452       Rate Your Plate Scores   Pre Score 42            MEDIFICTS Score Key: >=70 Need to make dietary changes  40-70 Heart Healthy Diet <= 40 Therapeutic Level Cholesterol Diet  Flowsheet Row PULMONARY REHAB CHRONIC OBSTRUCTIVE PULMONARY DISEASE from 12/03/2022 in [facility]  Picture Your Plate Total Score on Admission 42      Picture Your Plate Scores: <65 Unhealthy dietary pattern with much room for improvement. 41-50 Dietary pattern unlikely to meet recommendations for good health and room for improvement. 51-60 More healthful dietary pattern, with some room for improvement.  >60 Healthy dietary pattern, although there may be some specific behaviors that could be improved.    Nutrition Goals Re-Evaluation:  Nutrition Goals Re-Evaluation     Row Name 12/03/22 1143 12/31/22 1122 01/29/23 1049         Goals   Current Weight 247 lb 12.8 oz (112.4 kg) 247 lb 2.2 oz (112.1 kg) 242 lb 15.2 oz (110.2 kg)     Comment triglycerides 184, LDL 101 no new labs; most recent labs  triglycerides 184, LDL 101 no new labs; most recent labs triglycerides 184, LDL 101     Expected Outcome Bill reports motivation to lose weight. He eats two meals daily and enjoys some cooking and convenience type cooking using frozen veggies, etc. He continues to limit sodium in his diet. Patient will benefit from participation in pulmonary rehab  for nutrition, exercise, and lifestyle modification. Goals in progress. Bill reports motivation to lose weight. He eats two meals daily and enjoys some cooking and convenience type cooking using frozen veggies, etc. He continues to limit sodium in his diet. We have discussed multiple strategies for weight loss including reducing calories from high carb/high fat meals, increase low calorie dense foods, using the plate method for planning, etc. Annette Stable is down 4.6# since starting with our program per orientation weight. Patient will benefit from participation in pulmonary rehab for nutrition, exercise, and lifestyle modification. Goals in progress. Bill reports motivation to lose weight; he is down 8.8# since starting with our program (orientation weight was 114.2kg) . We have discussed multiple strategies for weight loss including reducing calories from high carb/high fat meals, increase low calorie dense foods, using the plate method for planning, etc. He has started making many dietary changes including increased vegetable/ fruit intake, decreased portion sizes, and decreased fat/carbohydrate intake.Patient will benefit from participation in pulmonary rehab for nutrition, exercise, and lifestyle modification.              Nutrition Goals Discharge (Final Nutrition Goals Re-Evaluation):  Nutrition Goals Re-Evaluation - 01/29/23 1049       Goals   Current Weight 242 lb 15.2 oz (110.2 kg)    Comment no new labs; most recent labs triglycerides 184, LDL 101    Expected Outcome  Goals in progress. Bill reports motivation to lose weight; he is down 8.8# since starting with our program (orientation weight was 114.2kg) . We have discussed multiple strategies for weight loss including reducing calories from high carb/high fat meals, increase low calorie dense foods, using the plate method for planning, etc. He has started making many dietary changes including increased vegetable/ fruit intake, decreased portion  sizes, and decreased fat/carbohydrate intake.Patient will benefit from participation in pulmonary rehab for nutrition, exercise, and lifestyle modification.             Psychosocial: Target Goals: Acknowledge presence or absence of significant depression and/or stress, maximize coping skills, provide positive support system. Participant is able to verbalize types and ability to use techniques and skills needed for reducing stress and depression.  Initial Review & Psychosocial Screening:  Initial Psych Review & Screening - 11/27/22 1056       Initial Review   Current issues with None Identified      Family Dynamics   Good Support System? Yes    Concerns --   none   Comments Pt denies concerns      Barriers   Psychosocial barriers to participate in program There are no identifiable barriers or psychosocial needs.      Screening Interventions   Interventions Encouraged to exercise    Expected Outcomes Long Term Goal: Stressors or current issues are controlled or eliminated.             Quality of Life Scores:  Scores of 19 and below usually indicate a poorer quality of life in these areas.  A difference of  2-3 points is a clinically meaningful difference.  A difference of 2-3 points in the total score of the Quality of Life Index has been associated with significant improvement in overall quality of life, self-image, physical symptoms, and general health in studies assessing change in quality of life.  PHQ-9: Review Flowsheet       11/27/2022  Depression screen PHQ 2/9  Decreased Interest 1  Down, Depressed, Hopeless 0  PHQ - 2 Score 1  Altered sleeping 1  Tired, decreased energy 1  Change in appetite 1  Feeling bad or failure about yourself  0  Trouble concentrating 0  Moving slowly or fidgety/restless 0  Suicidal thoughts 0  PHQ-9 Score 4  Difficult doing work/chores Not difficult at all    Details           Interpretation of Total Score  Total Score  Depression Severity:  1-4 = Minimal depression, 5-9 = Mild depression, 10-14 = Moderate depression, 15-19 = Moderately severe depression, 20-27 = Severe depression   Psychosocial Evaluation and Intervention:  Psychosocial Evaluation - 11/27/22 1057       Psychosocial Evaluation & Interventions   Interventions Encouraged to exercise with the program and follow exercise prescription    Comments Rony denies any psychosocial barriers or concerns    Expected Outcomes For [place] to participate in PR without any barriers or concerns    Continue Psychosocial Services  No Follow up required             Psychosocial Re-Evaluation:  Psychosocial Re-Evaluation     Row Name 12/18/22 1047 01/11/23 1333 02/15/23 0957         Psychosocial Re-Evaluation   Current issues with None Identified None Identified None Identified     Comments Bill denies any new psychosocial barriers or concerns at this time. Annette Stable continues to deny any psychosocial barriers or  concerns at this time. Annette Stable continues to deny any psychosocial barriers or concerns at this time.     Expected Outcomes For Bill to participate in PR free of any psychosocial barriers or concerns For Bill to participate in PR free of any psychosocial barriers or concerns For Bill to participate in PR free of any psychosocial barriers or concerns     Interventions Encouraged to attend Pulmonary Rehabilitation for the exercise Encouraged to attend Pulmonary Rehabilitation for the exercise Encouraged to attend Pulmonary Rehabilitation for the exercise     Continue Psychosocial Services  No Follow up required No Follow up required No Follow up required              Psychosocial Discharge (Final Psychosocial Re-Evaluation):  Psychosocial Re-Evaluation - 02/15/23 0957       Psychosocial Re-Evaluation   Current issues with None Identified    Comments Annette Stable continues to deny any psychosocial barriers or concerns at this time.    Expected  Outcomes For Bill to participate in PR free of any psychosocial barriers or concerns    Interventions Encouraged to attend Pulmonary Rehabilitation for the exercise    Continue Psychosocial Services  No Follow up required             Education: Education Goals: Education classes will be provided on a weekly basis, covering required topics. Participant will state understanding/return demonstration of topics presented.  Learning Barriers/Preferences:  Learning Barriers/Preferences - 11/27/22 1223       Learning Barriers/Preferences   Learning Barriers None    Learning Preferences Group Instruction;Verbal Instruction;Written Material             Education Topics: Know Your Numbers Group instruction that is supported by a PowerPoint presentation. Instructor discusses importance of knowing and understanding resting, exercise, and post-exercise oxygen saturation, heart rate, and blood pressure. Oxygen saturation, heart rate, blood pressure, rating of perceived exertion, and dyspnea are reviewed along with a normal range for these values.  Flowsheet Row PULMONARY REHAB CHRONIC OBSTRUCTIVE PULMONARY DISEASE from 12/24/2022 in [facility], [facility], [facility]  Date 12/24/22  Educator EP  Instruction Review Code 1- Verbalizes Understanding       Exercise for the Pulmonary Patient Group instruction that is supported by a PowerPoint presentation. Instructor discusses benefits of exercise, core components of exercise, frequency, duration, and intensity of an exercise routine, importance of utilizing pulse oximetry during exercise, safety while exercising, and options of places to exercise outside of rehab.  Flowsheet Row PULMONARY REHAB CHRONIC OBSTRUCTIVE PULMONARY DISEASE from 12/17/2022 in [facility] for [facility], Vascular, & Lung Health  Date 12/17/22  Educator EP  Instruction Review Code 1- Verbalizes Understanding        MET Level  Group instruction provided by PowerPoint, verbal discussion, and written material to support subject matter. Instructor reviews what METs are and how to increase METs.    Pulmonary Medications Verbally interactive group education provided by instructor with focus on inhaled medications and proper administration. Flowsheet Row PULMONARY REHAB CHRONIC OBSTRUCTIVE PULMONARY DISEASE from 12/10/2022 in [facility] for Heart, Vascular, & Lung Health  Date 12/10/22  Educator RT  Instruction Review Code 1- Verbalizes Understanding       Anatomy and Physiology of the Respiratory System Group instruction provided by PowerPoint, verbal discussion, and written material to support subject matter. Instructor reviews respiratory cycle and anatomical components of the respiratory system and their functions. Instructor also reviews  differences in obstructive and restrictive respiratory diseases with examples of each.  Flowsheet Row PULMONARY REHAB CHRONIC OBSTRUCTIVE PULMONARY DISEASE from 12/03/2022 in [facility] for [facility], Vascular, & Lung Health  Date 12/03/22  Educator RT  Instruction Review Code 1- Verbalizes Understanding       Oxygen Safety Group instruction provided by PowerPoint, verbal discussion, and written material to support subject matter. There is an overview of "What is Oxygen" and "Why do we need it".  Instructor also reviews how to create a safe environment for oxygen use, the importance of using oxygen as prescribed, and the risks of noncompliance. There is a brief discussion on traveling with oxygen and resources the patient may utilize. Flowsheet Row PULMONARY REHAB CHRONIC OBSTRUCTIVE PULMONARY DISEASE from 12/31/2022 in [facility] for Heart, Vascular, & Lung Health  Date 12/31/22  Educator RN  Instruction Review Code 1- Verbalizes Understanding       Oxygen Use Group instruction provided  by PowerPoint, verbal discussion, and written material to discuss how supplemental oxygen is prescribed and different types of oxygen supply systems. Resources for more information are provided.  Flowsheet Row PULMONARY REHAB CHRONIC OBSTRUCTIVE PULMONARY DISEASE from 01/07/2023 in [facility] for Heart, Vascular, & Lung Health  Date 01/07/23  Educator RT  Instruction Review Code 1- Verbalizes Understanding       Breathing Techniques Group instruction that is supported by demonstration and informational handouts. Instructor discusses the benefits of pursed lip and diaphragmatic breathing and detailed demonstration on how to perform both.  Flowsheet Row PULMONARY REHAB CHRONIC OBSTRUCTIVE PULMONARY DISEASE from 01/14/2023 in [facility] for Heart, Vascular, & Lung Health  Date 01/14/23  Educator RN  Instruction Review Code 1- Verbalizes Understanding        Risk Factor Reduction Group instruction that is supported by a PowerPoint presentation. Instructor discusses the definition of a risk factor, different risk factors for pulmonary disease, and how the heart and lungs work together. Flowsheet Row PULMONARY REHAB CHRONIC OBSTRUCTIVE PULMONARY DISEASE from 02/04/2023 in [facility] for Heart, Vascular, & Lung Health  Date 02/04/23  Educator EP  Instruction Review Code 1- Verbalizes Understanding       Pulmonary Diseases Group instruction provided by PowerPoint, verbal discussion, and written material to support subject matter. Instructor gives an overview of the different type of pulmonary diseases. There is also a discussion on risk factors and symptoms as well as ways to manage the diseases.   Stress and Energy Conservation Group instruction provided by PowerPoint, verbal discussion, and written material to support subject matter. Instructor gives an overview of stress and the impact it can have on the body.  Instructor also reviews ways to reduce stress. There is also a discussion on energy conservation and ways to conserve energy throughout the day. Flowsheet Row PULMONARY REHAB CHRONIC OBSTRUCTIVE PULMONARY DISEASE from 01/21/2023 in [facility] for Heart, Vascular, & Lung Health  Date 01/21/23  Educator RN  Instruction Review Code 1- Verbalizes Understanding       Warning Signs and Symptoms Group instruction provided by PowerPoint, verbal discussion, and written material to support subject matter. Instructor reviews warning signs and symptoms of stroke, heart attack, cold and flu. Instructor also reviews ways to prevent the spread of infection.   Other Education Group or individual verbal, written, or video instructions that support the educational goals of the pulmonary rehab program.    Knowledge Questionnaire  Score:  Knowledge Questionnaire Score - 11/27/22 1223       Knowledge Questionnaire Score   Pre Score 17/18             Core Components/Risk Factors/Patient Goals at Admission:  Personal Goals and Risk Factors at Admission - 11/27/22 1058       Core Components/Risk Factors/Patient Goals on Admission    Weight Management Yes    Intervention Weight Management/Obesity: Establish reasonable short term and long term weight goals.;Weight Management: Provide education and appropriate resources to help participant work on and attain dietary goals.;Weight Management: Develop a combined nutrition and exercise program designed to reach desired caloric intake, while maintaining appropriate intake of nutrient and fiber, sodium and fats, and appropriate energy expenditure required for the weight goal.;Obesity: Provide education and appropriate resources to help participant work on and attain dietary goals.    Admit Weight 251 lb 12.3 oz (114.2 kg)    Goal Weight: Short Term 241 lb (109.3 kg)    Goal Weight: Long Term 220 lb (99.8 kg)    Expected Outcomes Short  Term: Continue to assess and modify interventions until short term weight is achieved;Long Term: Adherence to nutrition and physical activity/exercise program aimed toward attainment of established weight goal;Weight Loss: Understanding of general recommendations for a balanced deficit meal plan, which promotes 1-2 lb weight loss per week and includes a negative energy balance of [phone] kcal/d;Understanding of distribution of calorie intake throughout the day with the consumption of 4-5 meals/snacks;Understanding recommendations for meals to include 15-35% energy as protein, 25-35% energy from fat, 35-60% energy from carbohydrates, less than 200mg  of dietary cholesterol, 20-35 gm of total fiber daily    Improve shortness of breath with ADL's Yes    Intervention Provide education, individualized exercise plan and daily activity instruction to help decrease symptoms of SOB with activities of daily living.    Expected Outcomes Short Term: Improve cardiorespiratory fitness to achieve a reduction of symptoms when performing ADLs;Long Term: Be able to perform more ADLs without symptoms or delay the onset of symptoms    Increase knowledge of respiratory medications and ability to use respiratory devices properly  Yes    Intervention Provide education and demonstration as needed of appropriate use of medications, inhalers, and oxygen therapy.    Expected Outcomes Short Term: Achieves understanding of medications use. Understands that oxygen is a medication prescribed by physician. Demonstrates appropriate use of inhaler and oxygen therapy.;Long Term: Maintain appropriate use of medications, inhalers, and oxygen therapy.             Core Components/Risk Factors/Patient Goals Review:   Goals and Risk Factor Review     Row Name 12/18/22 1048 01/11/23 1334 01/11/23 1335 02/15/23 0957       Core Components/Risk Factors/Patient Goals Review   Personal Goals Review Weight Management/Obesity;Improve shortness  of breath with ADL's Weight Management/Obesity Weight Management/Obesity;Improve shortness of breath with ADL's Weight Management/Obesity;Improve shortness of breath with ADL's    Review Goal progressing for weight loss. Annette Stable is working with our dietician for weight loss goal. Goal progressing on improving her shortness of breath with ADLs. Goal met on developing more efficient breathing techniques such as purse lipped breathing and diaphragmatic breathing; and practicing self-pacing with activity. Annette Stable is able to demonstrate purse lip breathing when he gets SOB. He also paces himself when walking on the treadmill. Goal met for increasing knowledge of respiratory medications and ability to use respiratory devices properly. He has demonstrated proper technique when using  his MDI to the respiratory therapist. -- Goal progressing for weight loss. Annette Stable is working with our dietician for weight loss goal. he is currently down 4.6#'s since starting the program. Goal progressing on improving her shortness of breath with ADLs. Annette Stable has been able to increase his workload and MET's on the bike. He has also been able to increase his speed and incline on the treadmill. We will continue to monitor Bill's progress throughout the program. Goal progressing for weight loss. Annette Stable is working with our dietitian to meet his weight loss goal. He is currently down 8.8#'s since starting the program. Goal progressing on improving his shortness of breath with ADLs. His sats have remained >88% on RA while exercising. Annette Stable has been able to increase his workload and MET's on the bike. He has also been able to increase his speed and incline on the treadmill. We will continue to monitor Bill's progress throughout the program.    Expected Outcomes For Bill to lose weight and improve his shortness of breath with ADL's. -- For Bill to lose weight and improve his shortness of breath with ADL's. For Bill to lose weight and improve his shortness of  breath with ADL's.             Core Components/Risk Factors/Patient Goals at Discharge (Final Review):   Goals and Risk Factor Review - 02/15/23 0957       Core Components/Risk Factors/Patient Goals Review   Personal Goals Review Weight Management/Obesity;Improve shortness of breath with ADL's    Review Goal progressing for weight loss. Annette Stable is working with our dietitian to meet his weight loss goal. He is currently down 8.8#'s since starting the program. Goal progressing on improving his shortness of breath with ADLs. His sats have remained >88% on RA while exercising. Annette Stable has been able to increase his workload and MET's on the bike. He has also been able to increase his speed and incline on the treadmill. We will continue to monitor Bill's progress throughout the program.    Expected Outcomes For Bill to lose weight and improve his shortness of breath with ADL's.             ITP Comments: Pt is making expected progress toward Pulmonary Rehab goals after completing 20 session(s). Recommend continued exercise, life style modification, education, and utilization of breathing techniques to increase stamina and strength, while also decreasing shortness of breath with exertion.  Dr. Mechele Collin is Medical Director for Pulmonary Rehab at [facility].

## 2023-02-18 ENCOUNTER — Encounter: Payer: Self-pay | Admitting: Pulmonary Disease

## 2023-02-23 ENCOUNTER — Encounter (HOSPITAL_COMMUNITY)
Admission: RE | Admit: 2023-02-23 | Discharge: 2023-02-23 | Disposition: A | Discharge status: Home / Self Care | Payer: PPO | Source: Ambulatory Visit | Attending: Pulmonary Disease | Admitting: Pulmonary Disease

## 2023-02-23 DIAGNOSIS — J449 Chronic obstructive pulmonary disease, unspecified: Secondary | ICD-10-CM

## 2023-02-23 NOTE — Progress Notes (Signed)
Daily Session Note  Patient Details  Name: Andrew [name] MRN: [id number] Date of Birth: [birth year] Referring Provider:   Doristine Devoid Pulmonary Rehab Walk Test from 11/27/2022 in [facility] for [facility], Vascular, & Lung [facility]  Referring Provider [name]       Encounter Date: 02/23/2023  Check In:  Session Check In - 02/23/23 1131       Check-In   Supervising physician immediately available to respond to emergencies [facility] MD immediately available    Physician(s) Eligha Bridegroom, NP    Location [facility]    Staff Present Essie Hart, RN, Doris Cheadle, MS, ACSM-CEP, Exercise Physiologist;Randi Idelle Crouch BS, ACSM-CEP, Exercise Physiologist;David Makemson, MS, ACSM-CEP, CCRP, Exercise Physiologist    Virtual Visit No    Medication changes reported     No    Fall or balance concerns reported    No    Tobacco Cessation No Change    Warm-up and Cool-down Performed as group-led instruction    Resistance Training Performed Yes    VAD Patient? No      Pain Assessment   Currently in Pain? No/denies             Capillary Blood Glucose: No results found for this or any previous visit (from the past 24 hour(s)).    Social History   Tobacco Use  Smoking Status Former   Current packs/day: 0.00   Average packs/day: 0.5 packs/day for 40.0 years (20.0 ttl pk-yrs)   Types: Cigarettes   Start date: 03/23/1978   Quit date: 03/23/2018   Years since quitting: 4.9  Smokeless Tobacco Never    Goals Met:  Proper associated with RPD/PD & O2 Sat Independence with exercise equipment Exercise tolerated well No report of concerns or symptoms today Strength training completed today  Goals Unmet:  Not Applicable  Comments: Service time is from 1008 to 1150.    Dr. Mechele Collin is Medical Director for Pulmonary Rehab at [facility].

## 2023-02-24 NOTE — Progress Notes (Addendum)
Discharge Progress Report  Patient Details  Name: Andrew [name] MRN: [id number] Date of Birth: 10-16-1955 Referring Provider:   Doristine Devoid Pulmonary Rehab Walk Test from 11/27/2022 in [facility]  Referring Provider [name]        Number of Visits: 22  Reason for Discharge:  Patient reached a [name] level of exercise. Patient independent in their exercise. Patient has met program and personal goals.  Smoking History:  Social History   Tobacco Use  Smoking Status Former   Current packs/day: 0.00   Average packs/day: 0.5 packs/day for 40.0 years (20.0 ttl pk-yrs)   Types: Cigarettes   Start date: 03/23/1978   Quit date: 03/23/2018   Years since quitting: 4.9  Smokeless Tobacco Never    Diagnosis:  Stage 2 moderate COPD by GOLD classification (HCC)  ADL [facility]:  Pulmonary Assessment Scores     Row Name 11/27/22 1051 02/23/23 1537       ADL [facility]   ADL Phase Entry Exit    SOB Score total 25 25      CAT Score   CAT Score 13 8      mMRC Score   mMRC Score 2 0             Initial Exercise Prescription:  Initial Exercise Prescription - 11/27/22 1100       Date of Initial Exercise RX and Referring Provider   Date 11/27/22    Referring Provider [name]    Expected Discharge Date 02/16/23      Treadmill   MPH 2    Grade 0    Minutes 15    METs 2.54      Bike   Level 1    Watts 42    Minutes 15    METs 2.4      Prescription Details   Frequency (times per week) 2    Duration Progress to 30 minutes of continuous aerobic without signs/symptoms of physical distress      Intensity   THRR 40-80% of Max Heartrate 60-121    Ratings of Perceived Exertion 11-13    Perceived Dyspnea 0-4      Progression   Progression Continue to progress workloads to maintain intensity without signs/symptoms of physical distress.      Resistance Training   Training Prescription Yes    Weight blue bands     Reps 10-15             Discharge Exercise Prescription (Final Exercise Prescription Changes):  Exercise Prescription Changes - 02/16/23 1100       Response to Exercise   Blood Pressure (Admit) 110/60    Blood Pressure (Exercise) 150/64    Blood Pressure (Exit) 156/54    Heart Rate (Admit) 104 bpm    Heart Rate (Exercise) 143 bpm    Heart Rate (Exit) 117 bpm    Oxygen Saturation (Admit) 94 %    Oxygen Saturation (Exercise) 92 %    Oxygen Saturation (Exit) 94 %    Rating of Perceived Exertion (Exercise) 12    Perceived Dyspnea (Exercise) 2    Duration Continue with 30 min of aerobic exercise without signs/symptoms of physical distress.    Intensity THRR unchanged      Progression   Progression Continue to progress workloads to maintain intensity without signs/symptoms of physical distress.      Resistance Training   Training Prescription Yes    Weight double black bands  Reps 10-15    Time 10 Minutes      Treadmill   MPH 3    Grade 2    Minutes 15    METs 4      Bike   Level 3    Minutes 15    METs 3             Functional Capacity:  6 Minute Walk     Row Name 11/27/22 1150 02/23/23 1532       6 Minute Walk   Phase Initial Discharge    Distance 1297 feet 1506 feet    Distance % Change -- 16.11 %    Distance Feet Change -- 209 ft    Walk Time 6 minutes 6 minutes    # of Rest Breaks 0 0    MPH 2.46 2.85    METS 2.93 3.3    RPE 11 12    Perceived Dyspnea  1 2    VO2 Peak 10.24 11.55    Symptoms No No    Resting HR 107 bpm 97 bpm    Resting BP 138/70 118/64    Resting Oxygen Saturation  95 % 95 %    Exercise Oxygen Saturation  during 6 min walk 92 % 95 %    Max Ex. HR 116 bpm 123 bpm    Max Ex. BP 160/60 142/56    2 Minute Post BP 136/66 102/60      Interval HR   1 Minute HR 111 105    2 Minute HR 113 112    3 Minute HR 113 112    4 Minute HR 116 117    5 Minute HR 116 119    6 Minute HR 116 123    2 Minute Post HR 108 96    Interval  Heart Rate? Yes Yes      Interval Oxygen   Interval Oxygen? Yes Yes    Baseline Oxygen Saturation % 95 % 95 %    1 Minute Oxygen Saturation % 93 % 93 %    1 Minute Liters of Oxygen 0 L 0 L    2 Minute Oxygen Saturation % 92 % 93 %    2 Minute Liters of Oxygen 0 L 0 L    3 Minute Oxygen Saturation % 92 % 92 %    3 Minute Liters of Oxygen 0 L 0 L    4 Minute Oxygen Saturation % 93 % 93 %    4 Minute Liters of Oxygen 0 L 0 L    5 Minute Oxygen Saturation % 92 % 93 %    5 Minute Liters of Oxygen 0 L 0 L    6 Minute Oxygen Saturation % 92 % 92 %    6 Minute Liters of Oxygen 0 L 0 L    2 Minute Post Oxygen Saturation % 95 % 94 %    2 Minute Post Liters of Oxygen 0 L 0 L             Psychological, QOL, Others - Outcomes: PHQ 2/9:    02/24/2023   11:33 AM 11/27/2022   12:13 PM  Depression screen PHQ 2/9  Decreased Interest 1 1  Down, Depressed, Hopeless 0 0  PHQ - 2 Score 1 1  Altered sleeping 1 1  Tired, decreased energy 1 1  Change in appetite 0 1  Feeling bad or failure about yourself  0 0  Trouble concentrating 0 0  Moving slowly or fidgety/restless 0 0  Suicidal thoughts 0 0  PHQ-9 Score 3 4  Difficult doing work/chores Not difficult at all Not difficult at all    Quality of Life:   Personal Goals: Goals established at orientation with interventions provided to work toward goal.  Personal Goals and Risk Factors at Admission - 11/27/22 1058       Core Components/Risk Factors/Patient Goals on Admission    Weight Management Yes    Intervention Weight Management/Obesity: Establish reasonable short term and long term weight goals.;Weight Management: Provide education and appropriate resources to help participant work on and attain dietary goals.;Weight Management: Develop a combined nutrition and exercise program designed to reach desired caloric intake, while maintaining appropriate intake of nutrient and fiber, sodium and fats, and appropriate energy expenditure  required for the weight goal.;Obesity: Provide education and appropriate resources to help participant work on and attain dietary goals.    Admit Weight 251 lb 12.3 oz (114.2 kg)    Goal Weight: Short Term 241 lb (109.3 kg)    Goal Weight: Long Term 220 lb (99.8 kg)    Expected Outcomes Short Term: Continue to assess and modify interventions until short term weight is achieved;Long Term: Adherence to nutrition and physical activity/exercise program aimed toward attainment of established weight goal;Weight Loss: Understanding of general recommendations for a balanced deficit meal plan, which promotes 1-2 lb weight loss per week and includes a negative energy balance of [phone] kcal/d;Understanding of distribution of calorie intake throughout the day with the consumption of 4-5 meals/snacks;Understanding recommendations for meals to include 15-35% energy as protein, 25-35% energy from fat, 35-60% energy from carbohydrates, less than 200mg  of dietary cholesterol, 20-35 gm of total fiber daily    Improve shortness of breath with ADL's Yes    Intervention Provide education, individualized exercise plan and daily activity instruction to help decrease symptoms of SOB with activities of daily living.    Expected Outcomes Short Term: Improve cardiorespiratory fitness to achieve a reduction of symptoms when performing ADLs;Long Term: Be able to perform more ADLs without symptoms or delay the onset of symptoms    Increase knowledge of respiratory medications and ability to use respiratory devices properly  Yes    Intervention Provide education and demonstration as needed of appropriate use of medications, inhalers, and oxygen therapy.    Expected Outcomes Short Term: Achieves understanding of medications use. Understands that oxygen is a medication prescribed by physician. Demonstrates appropriate use of inhaler and oxygen therapy.;Long Term: Maintain appropriate use of medications, inhalers, and oxygen therapy.               Personal Goals Discharge:  Goals and Risk Factor Review     Row Name 12/18/22 1048 01/11/23 1334 01/11/23 1335 02/15/23 0957       Core Components/Risk Factors/Patient Goals Review   Personal Goals Review Weight Management/Obesity;Improve shortness of breath with ADL's Weight Management/Obesity Weight Management/Obesity;Improve shortness of breath with ADL's Weight Management/Obesity;Improve shortness of breath with ADL's    Review Goal progressing for weight loss. Andrew [name] is working with our dietician for weight loss goal. Goal progressing on improving her shortness of breath with ADLs. Goal met on developing more efficient breathing techniques such as purse lipped breathing and diaphragmatic breathing; and practicing self-pacing with activity. Andrew [name] is able to demonstrate purse lip breathing when he gets SOB. He also paces himself when walking on the treadmill. Goal met for increasing knowledge of respiratory medications and ability to use respiratory devices properly.  He has demonstrated proper technique when using his MDI to the respiratory therapist. -- Goal progressing for weight loss. Andrew [name] is working with our dietician for weight loss goal. he is currently down 4.6#'s since starting the program. Goal progressing on improving her shortness of breath with ADLs. Andrew [name] has been able to increase his workload and MET's on the bike. He has also been able to increase his speed and incline on the treadmill. We will continue to monitor Andrew [name]'s progress throughout the program. Goal progressing for weight loss. Andrew [name] is working with our dietitian to meet his weight loss goal. He is currently down 8.8#'s since starting the program. Goal progressing on improving his shortness of breath with ADLs. His sats have remained >88% on RA while exercising. Andrew [name] has been able to increase his workload and MET's on the bike. He has also been able to increase his speed and incline on the treadmill. We will continue  to monitor Andrew [name]'s progress throughout the program.    Expected Outcomes For Andrew [name] to lose weight and improve his shortness of breath with ADL's. -- For Andrew [name] to lose weight and improve his shortness of breath with ADL's. For Andrew [name] to lose weight and improve his shortness of breath with ADL's.             Exercise Goals and Review:  Exercise Goals     Row Name 11/27/22 1100             Exercise Goals   Increase Physical Activity Yes       Intervention Provide advice, education, support and counseling about physical activity/exercise needs.;Develop an individualized exercise prescription for aerobic and resistive training based on initial evaluation findings, risk stratification, comorbidities and participant's personal goals.       Expected Outcomes Long Term: Exercising regularly at least 3-5 days a week.;Long Term: Add in home exercise to make exercise part of routine and to increase amount of physical activity.;Short Term: Attend rehab on a regular basis to increase amount of physical activity.       Increase Strength and Stamina Yes       Intervention Provide advice, education, support and counseling about physical activity/exercise needs.;Develop an individualized exercise prescription for aerobic and resistive training based on initial evaluation findings, risk stratification, comorbidities and participant's personal goals.       Expected Outcomes Short Term: Increase workloads from initial exercise prescription for resistance, speed, and METs.;Short Term: Perform resistance training exercises routinely during rehab and add in resistance training at home;Long Term: Improve cardiorespiratory fitness, muscular endurance and strength as measured by increased METs and functional capacity ( )       Able to understand and use rate of perceived exertion (RPE) scale Yes       Intervention Provide education and explanation on how to use RPE scale       Expected Outcomes Short Term: Able to use  RPE daily in rehab to express subjective intensity level;Long Term:  Able to use RPE to guide intensity level when exercising independently       Able to understand and use Dyspnea scale Yes       Intervention Provide education and explanation on how to use Dyspnea scale       Expected Outcomes Short Term: Able to use Dyspnea scale daily in rehab to express subjective sense of shortness of breath during exertion;Long Term: Able to use Dyspnea scale to guide intensity level when exercising independently       Knowledge and  understanding of Target Heart Rate Range (THRR) Yes       Intervention Provide education and explanation of THRR including how the numbers were predicted and where they are located for reference       Expected Outcomes Short Term: Able to state/look up THRR;Short Term: Able to use daily as guideline for intensity in rehab;Long Term: Able to use THRR to govern intensity when exercising independently       Understanding of Exercise Prescription Yes       Intervention Provide education, explanation, and written materials on patient's individual exercise prescription       Expected Outcomes Short Term: Able to explain program exercise prescription;Long Term: Able to explain home exercise prescription to exercise independently                Exercise Goals Re-Evaluation:  Exercise Goals Re-Evaluation     Row Name 12/16/22 0653 01/11/23 1034 02/10/23 1102 02/25/23 1214       Exercise Goal Re-Evaluation   Exercise Goals Review Increase Physical Activity;Increase Strength and Stamina;Able to understand and use rate of perceived exertion (RPE) scale;Able to understand and use Dyspnea scale;Knowledge and understanding of Target Heart Rate Range (THRR);Understanding of Exercise Prescription Increase Physical Activity;Increase Strength and Stamina;Able to understand and use rate of perceived exertion (RPE) scale;Able to understand and use Dyspnea scale;Knowledge and understanding of  Target Heart Rate Range (THRR);Understanding of Exercise Prescription Increase Physical Activity;Increase Strength and Stamina;Able to understand and use rate of perceived exertion (RPE) scale;Able to understand and use Dyspnea scale;Knowledge and understanding of Target Heart Rate Range (THRR);Understanding of Exercise Prescription Increase Physical Activity;Increase Strength and Stamina;Able to understand and use rate of perceived exertion (RPE) scale;Able to understand and use Dyspnea scale;Knowledge and understanding of Target Heart Rate Range (THRR);Understanding of Exercise Prescription    Comments Andrew [name] has completed 4 exercise sessions. He exercises for 15 min on the treadmill and upright bike. He averages 2.54 METs at 2 mph and 0 incline on the treadmill and 2.4 METs at level 2 on the upright bike. Andrew [name] performs the warmup and cooldown standing without limitations. He has increased his workload slightly on the treadmill. This is due to him tolerating the treadmill better. He level has remained the same on the upright bike. It is too soon to notate any discernable progressions. Will continue to monitor and progress as able. Andrew [name] has completed 11 exercise sessions. He exercises for 15 min on the treadmill and upright bike. He averages 2.84 METs at 2.4 mph and 0 incline on the treadmill and 2.6 METs at level 2.4 on the upright bike. Andrew [name] performs the warmup and cooldown standing without limitations. He has increased his workload and METs for the treadmill and upright bike. He tolerates progressions well. Andrew [name] seems motivated to exercise and improve his functional capacity. He understands his ExRx well and knows how to increase his workloads as staff have encouraged consistent progressions. Will continue to monitor and progress as able. Andrew [name] has completed 19 exercise sessions. He exercises for 15 min on the treadmill and upright bike. He averages 3.7 METs at 2.8 mph and 2% incline on the treadmill and 2.9 METs  at level 2.8 on the upright bike. Andrew [name] performs the warmup and cooldown standing without limitations. Andrew [name] continues to increase his workloads for both exercise modes as METs have increased. He continues to tolerate progressions well. Andrew [name] is still exercising outside of rehab. Will continue to monitor and progress as able. Andrew [name] has completed 22 exercise sessions.  His peak METs were 4.0 on the treadmill and 3.0 on the upright bike. I am confident in Andrew [name] exercising outside of rehab. He is currently attending [employer] and plans to continue.    Expected Outcomes Through exercise at rehab and home, the patient will decrease shortness of breath with daily activities and feel confident in carrying out an exercise regimen at home. Through exercise at rehab and home, the patient will decrease shortness of breath with daily activities and feel confident in carrying out an exercise regimen at home. Through exercise at rehab and home, the patient will decrease shortness of breath with daily activities and feel confident in carrying out an exercise regimen at home. Through exercise at rehab and home, the patient will decrease shortness of breath with daily activities and feel confident in carrying out an exercise regimen at home.             Nutrition & Weight - Outcomes:  Pre Biometrics - 11/27/22 1043       Pre Biometrics   Grip Strength 45 kg              Nutrition:  Nutrition Therapy & Goals - 01/29/23 1049       Nutrition Therapy   Diet Heart Healthy Diet    Drug/Food Interactions Statins/Certain Fruits      Personal Nutrition Goals   Nutrition Goal Patient to improve diet quality by using the plate method as a guide for meal planning to include lean protein/plant protein, fruits, vegetables, whole grains, nonfat dairy as part of a well-balanced diet.   goal in progress.   Personal Goal #2 Patient to identify strategies or weight loss of 0.5-2.0# per week.   goal in progress.    Comments Goals in progress. Andrew [name] reports motivation to lose weight; he is down 8.8# since starting with our program (orientation weight was 114.2kg) .  We have discussed multiple strategies for weight loss including reducing calories from high carb/high fat meals, increase low calorie dense foods, using the plate method for planning, etc. He has started making many dietary changes including increased vegetable/ fruit intake, decreased portion sizes, and decreased fat/carbohydrate intake.Patient will benefit from participation in pulmonary rehab for nutrition, exercise, and lifestyle modification.      Intervention Plan   Intervention Prescribe, educate and counsel regarding individualized specific dietary modifications aiming towards targeted core components such as weight, hypertension, lipid management, diabetes, heart failure and other comorbidities.;Nutrition handout(s) given to patient.    Expected Outcomes Short Term Goal: Understand basic principles of dietary content, such as calories, fat, sodium, cholesterol and nutrients.;Long Term Goal: Adherence to prescribed nutrition plan.;Short Term Goal: A plan has been developed with personal nutrition goals set during dietitian appointment.             Nutrition Discharge:  Nutrition Assessments - 12/03/22 1452       Rate Your Plate Scores   Pre Score 42             Education Questionnaire Score:  Knowledge Questionnaire Score - 11/27/22 1223       Knowledge Questionnaire Score   Pre Score 17/18            Andrew [name] graduated Pulmonary Rehab on 02/25/23 completing 22 sessions. At the time of discharge, Andrew [name] denied any psychosocial barriers or concerns.   Goal met for weight loss. Andrew [name] lost ~4.6#s since starting the program.  Andrew [name] worked with our dietitian to meet his weight loss goal. He now  has the tools to continue his weight loss journey. Goal met on improving his shortness of breath with ADLs. His oxygen saturations remained  >88% on room air while exercising. Andrew [name] has also been able to increase his workload and MET's on the upright bike. He has also been able to increase his speed and incline on the treadmill. We are proud of the success Andrew [name] has had in the program!   Goals reviewed with patient; copy given to patient.

## 2023-02-25 ENCOUNTER — Encounter (HOSPITAL_COMMUNITY)
Admission: RE | Admit: 2023-02-25 | Discharge: 2023-02-25 | Disposition: A | Discharge status: Home / Self Care | Payer: PPO | Source: Ambulatory Visit | Attending: Pulmonary Disease | Admitting: Pulmonary Disease

## 2023-02-25 DIAGNOSIS — J449 Chronic obstructive pulmonary disease, unspecified: Secondary | ICD-10-CM

## 2023-03-02 ENCOUNTER — Other Ambulatory Visit: Payer: PPO

## 2023-03-08 DIAGNOSIS — M519 Unspecified thoracic, thoracolumbar and lumbosacral intervertebral disc disorder: Secondary | ICD-10-CM | POA: Diagnosis not present

## 2023-03-08 DIAGNOSIS — R911 Solitary pulmonary nodule: Secondary | ICD-10-CM | POA: Diagnosis not present

## 2023-03-08 DIAGNOSIS — I10 Essential (primary) hypertension: Secondary | ICD-10-CM | POA: Diagnosis not present

## 2023-03-08 DIAGNOSIS — I700 Atherosclerosis of aorta: Secondary | ICD-10-CM | POA: Diagnosis not present

## 2023-03-08 DIAGNOSIS — E669 Obesity, unspecified: Secondary | ICD-10-CM | POA: Diagnosis not present

## 2023-03-08 DIAGNOSIS — Z23 Encounter for immunization: Secondary | ICD-10-CM | POA: Diagnosis not present

## 2023-03-08 DIAGNOSIS — B370 Candidal stomatitis: Secondary | ICD-10-CM | POA: Diagnosis not present

## 2023-03-08 DIAGNOSIS — E7800 Pure hypercholesterolemia, unspecified: Secondary | ICD-10-CM | POA: Diagnosis not present

## 2023-03-08 DIAGNOSIS — J439 Emphysema, unspecified: Secondary | ICD-10-CM | POA: Diagnosis not present

## 2023-03-08 DIAGNOSIS — Z125 Encounter for screening for malignant neoplasm of prostate: Secondary | ICD-10-CM | POA: Diagnosis not present

## 2023-03-08 DIAGNOSIS — Z0000 Encounter for general adult medical examination without abnormal findings: Secondary | ICD-10-CM | POA: Diagnosis not present

## 2023-08-20 DIAGNOSIS — I10 Essential (primary) hypertension: Secondary | ICD-10-CM | POA: Diagnosis not present

## 2023-08-20 DIAGNOSIS — J439 Emphysema, unspecified: Secondary | ICD-10-CM | POA: Diagnosis not present

## 2023-08-21 DIAGNOSIS — E669 Obesity, unspecified: Secondary | ICD-10-CM | POA: Diagnosis not present

## 2023-08-21 DIAGNOSIS — I10 Essential (primary) hypertension: Secondary | ICD-10-CM | POA: Diagnosis not present

## 2023-08-21 DIAGNOSIS — E7800 Pure hypercholesterolemia, unspecified: Secondary | ICD-10-CM | POA: Diagnosis not present

## 2023-08-21 DIAGNOSIS — J439 Emphysema, unspecified: Secondary | ICD-10-CM | POA: Diagnosis not present

## 2023-09-17 IMAGING — CT CT CHEST W/ CM
2 of 5 series · 15 of 36 positions shown, 18 images · IV contrast (agent unspecified)
Comparison: None Available.

CLINICAL DATA: Cough

EXAM:
CT CHEST WITH CONTRAST
TECHNIQUE: Multidetector CT imaging of the chest was performed during
intravenous contrast administration.

[Series 4: chest 2.00 br40 s3 · coronal · 0.76mm/px · 3 of 188 slices shown]
[im 38/188  lung]
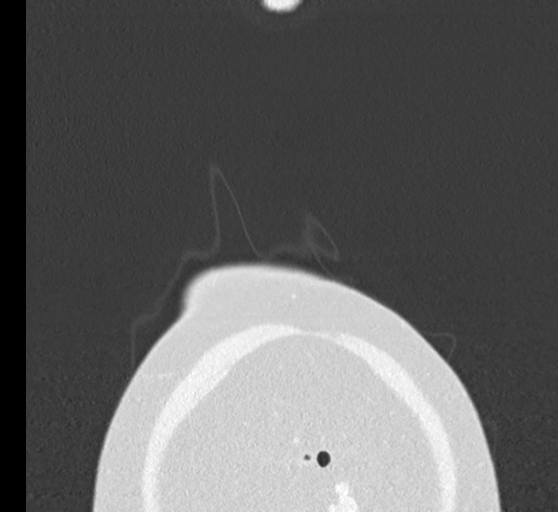
[im 75/188  lung]
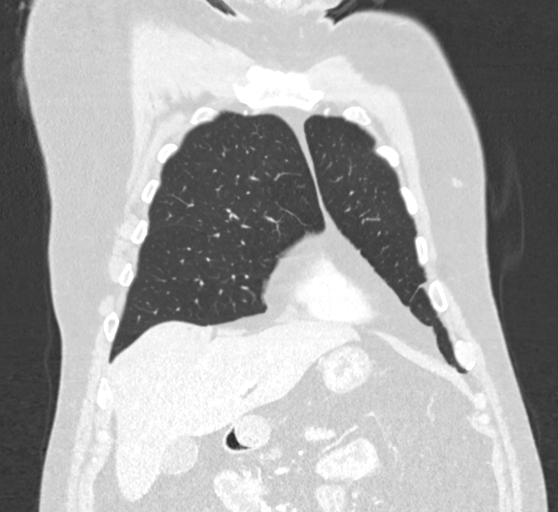
[im 113/188  lung]
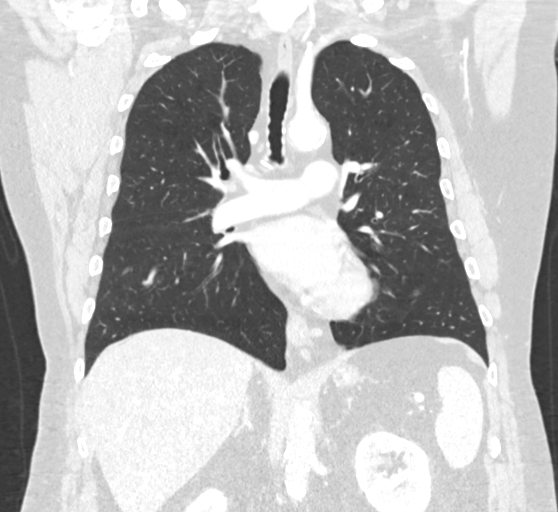

[Series 10: chest 1.00 br40 s3 · axial · 0.83mm/px · z∈[+1518,+1856]mm · 12 of 488 slices shown, 15 images]
[im 33/488  mediastinal]
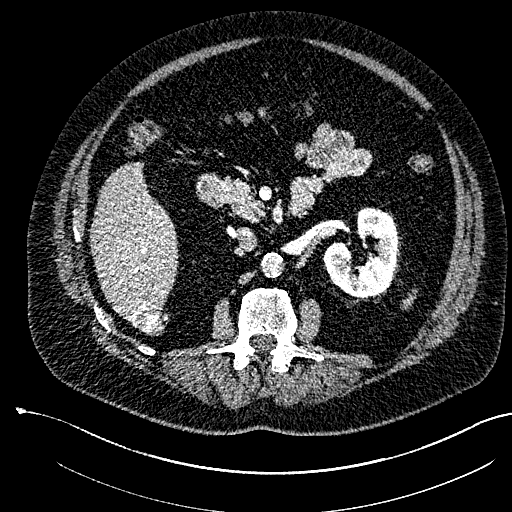
[im 33/488  lung]
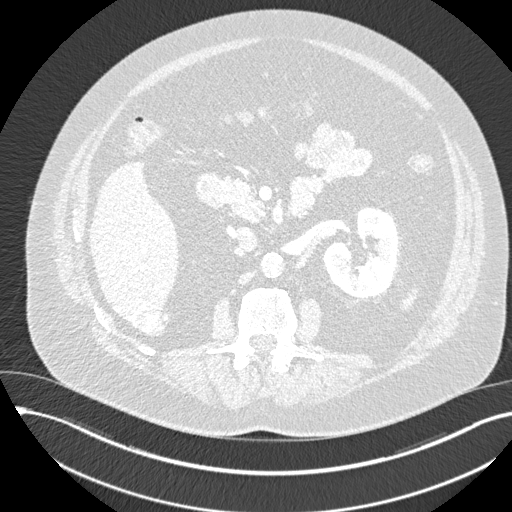
[im 65/488  lung]
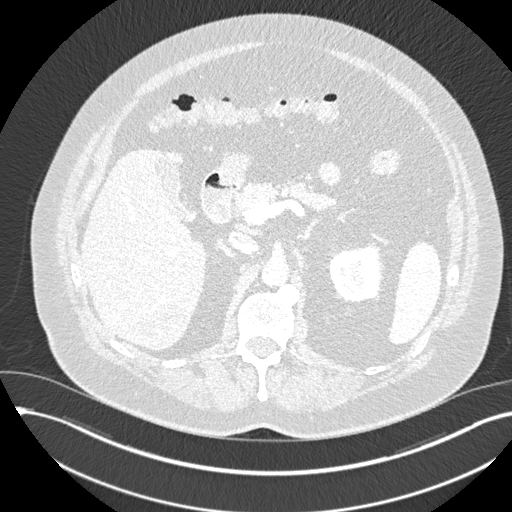
[im 98/488  lung]
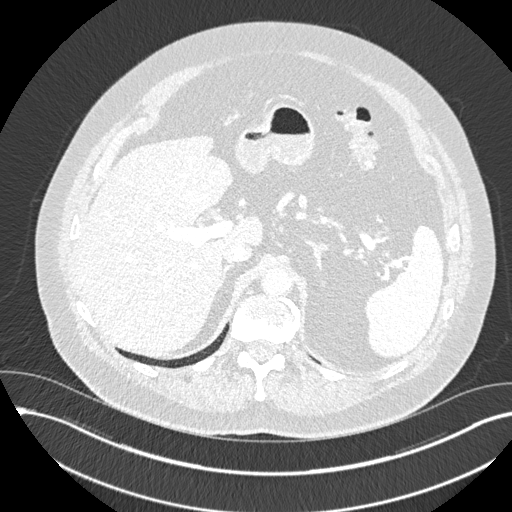
[im 163/488  lung]
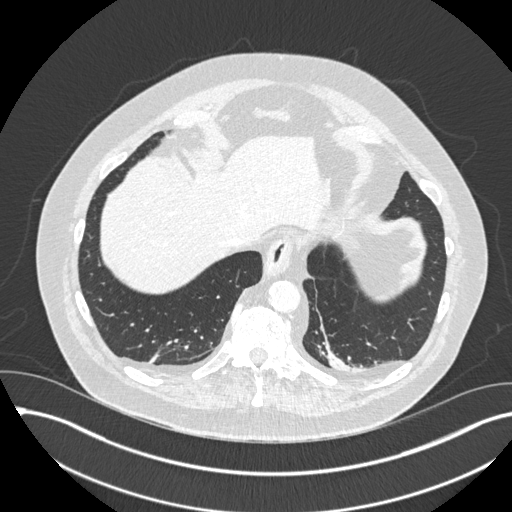
[im 195/488  mediastinal]
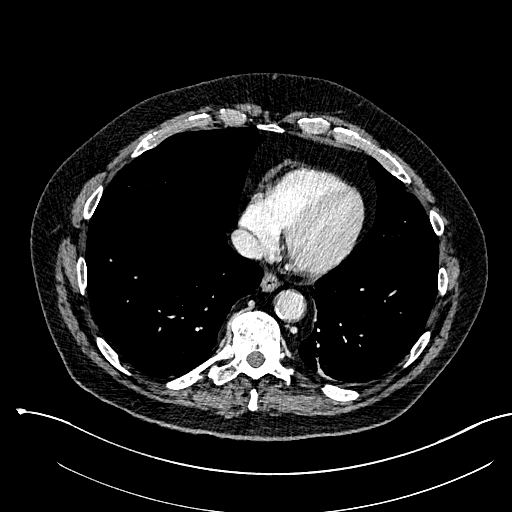
[im 195/488  lung]
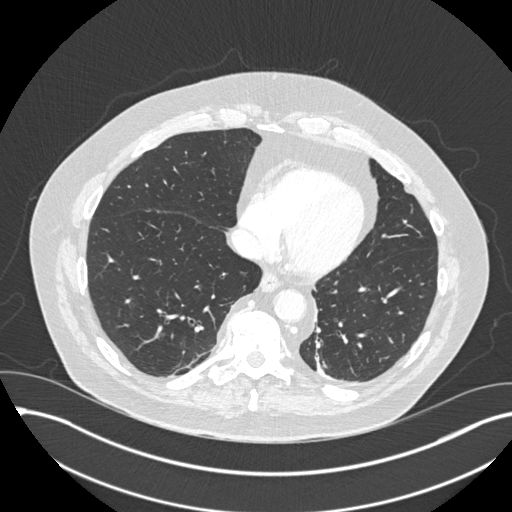
[im 228/488  lung]
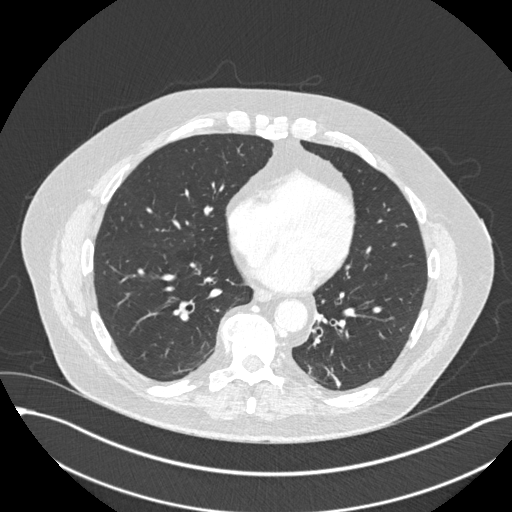
[im 260/488  lung]
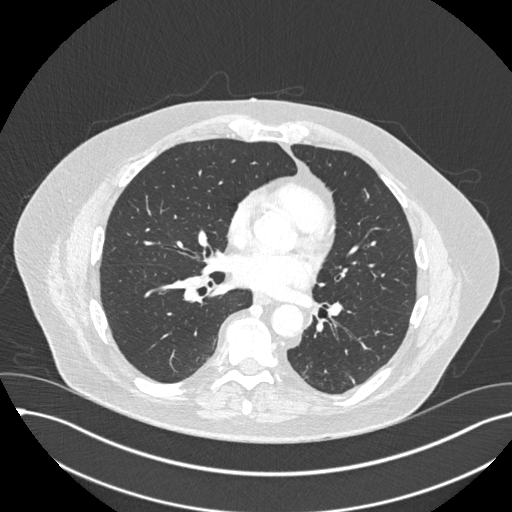
[im 293/488  lung]
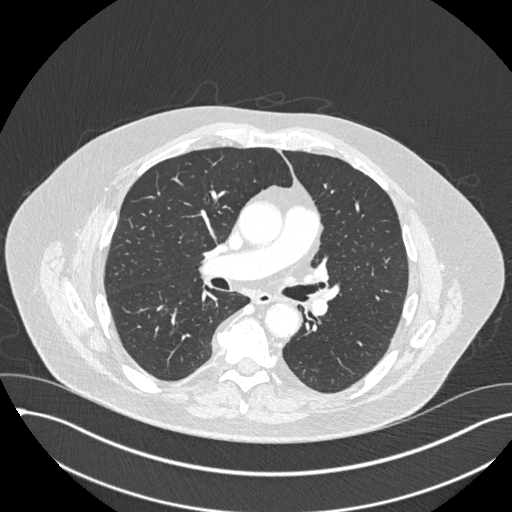
[im 325/488  mediastinal]
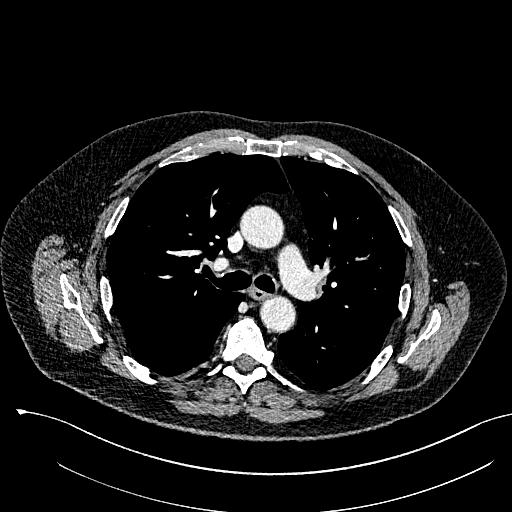
[im 325/488  lung]
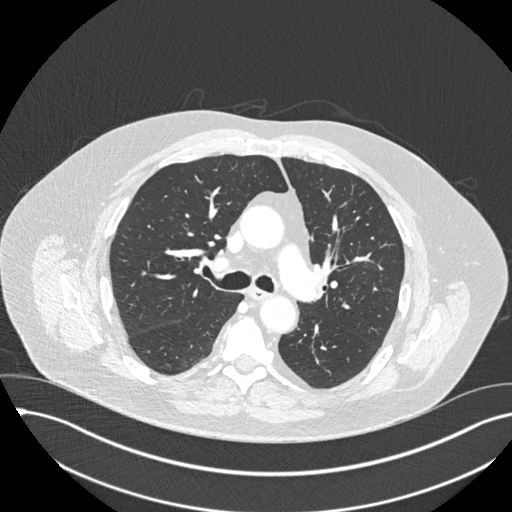
[im 390/488  lung]
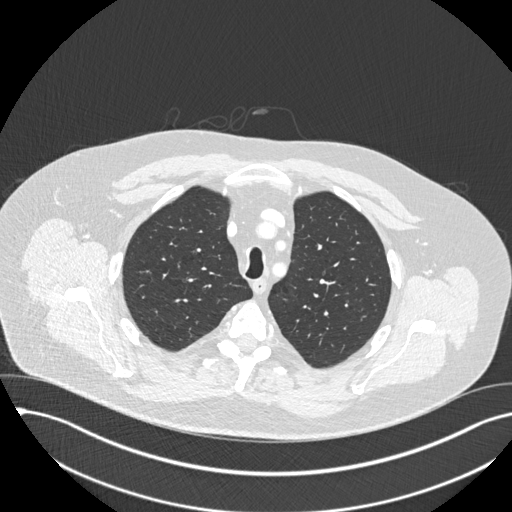
[im 423/488  lung]
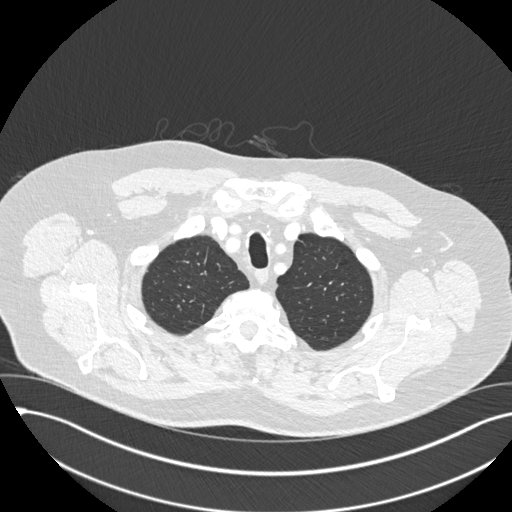
[im 455/488  lung]
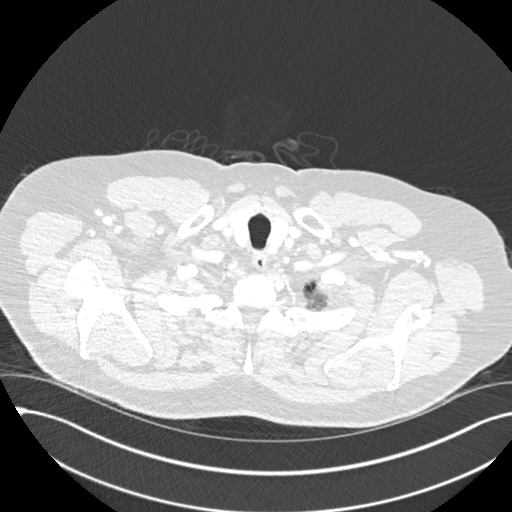

[15 of 36 positions shown; findings below may reference images not displayed]

RADIATION DOSE REDUCTION: This exam was performed according to the
departmental dose-optimization program which includes automated
exposure control, adjustment of the mA and/or kV according to
patient size and/or use of iterative reconstruction technique.

CONTRAST:  75mL 45LHVT-C11 IOPAMIDOL (45LHVT-C11) INJECTION 61%
FINDINGS: Cardiovascular: Normal heart size. No pericardial effusion. Mild
coronary artery calcifications of the LAD and RCA. Atherosclerotic
disease of the thoracic aorta.

Mediastinum/Nodes: Normal thyroid. Small hiatal hernia. No
pathologically enlarged lymph nodes seen in the chest.

Lungs/Pleura: Central airways are patent. Mild centrilobular
emphysema. Mild linear consolidations of the left-greater-than-right
lower lobes, likely due to scarring or atelectasis. Multiple small
solid pulmonary nodules, largest is located in the superior portion
of the left lower lobe and measures 5 mm in mean diameter on image
104. Part solid nodule of the right upper lobe measuring 10 mm in
overall diameter with 3 mm solid component, located on series 10,
image 87.

Upper Abdomen: Lesion of the right lobe of the liver measuring up to
4.4 cm with peripheral enhancement.

Musculoskeletal: No chest wall abnormality. No acute or significant
osseous findings.
IMPRESSION: 1. No acute airspace opacity.
2. Part solid nodule of the right upper lobe measuring 10 mm with 3
mm solid component. Follow-up non-contrast CT recommended at 3-6
months to confirm persistence. If unchanged, and solid component
remains <6 mm, annual CT is recommended until 5 years of stability
has been established. If persistent these nodules should be
considered highly suspicious if the solid component of the nodule is
6 mm or greater in size and enlarging. This recommendation follows
the consensus statement: Guidelines for Management of Incidental
Pulmonary Nodules Detected on CT Images: From the [HOSPITAL]
3. Additional small bilateral solid nodules are seen. Recommend
attention on follow-up.
4. Lesion of the right lobe of the liver with peripheral
enhancement, most likely a benign hemangioma, but technically
indeterminate. Could be further characterized with contrast-enhanced
liver MRI.
5. Aortic Atherosclerosis (81OGS-GY3.3) and Emphysema (81OGS-S8N.8).

## 2023-09-18 DIAGNOSIS — I10 Essential (primary) hypertension: Secondary | ICD-10-CM | POA: Diagnosis not present

## 2023-09-18 DIAGNOSIS — J439 Emphysema, unspecified: Secondary | ICD-10-CM | POA: Diagnosis not present

## 2023-09-20 DIAGNOSIS — J439 Emphysema, unspecified: Secondary | ICD-10-CM | POA: Diagnosis not present

## 2023-09-20 DIAGNOSIS — I10 Essential (primary) hypertension: Secondary | ICD-10-CM | POA: Diagnosis not present

## 2023-10-18 DIAGNOSIS — I10 Essential (primary) hypertension: Secondary | ICD-10-CM | POA: Diagnosis not present

## 2023-10-18 DIAGNOSIS — J439 Emphysema, unspecified: Secondary | ICD-10-CM | POA: Diagnosis not present

## 2023-10-21 DIAGNOSIS — E7800 Pure hypercholesterolemia, unspecified: Secondary | ICD-10-CM | POA: Diagnosis not present

## 2023-10-21 DIAGNOSIS — J439 Emphysema, unspecified: Secondary | ICD-10-CM | POA: Diagnosis not present

## 2023-10-21 DIAGNOSIS — I10 Essential (primary) hypertension: Secondary | ICD-10-CM | POA: Diagnosis not present

## 2023-10-21 DIAGNOSIS — E669 Obesity, unspecified: Secondary | ICD-10-CM | POA: Diagnosis not present

## 2023-10-26 DIAGNOSIS — D123 Benign neoplasm of transverse colon: Secondary | ICD-10-CM | POA: Diagnosis not present

## 2023-10-26 DIAGNOSIS — Z09 Encounter for follow-up examination after completed treatment for conditions other than malignant neoplasm: Secondary | ICD-10-CM | POA: Diagnosis not present

## 2023-10-26 DIAGNOSIS — Z860101 Personal history of adenomatous and serrated colon polyps: Secondary | ICD-10-CM | POA: Diagnosis not present

## 2023-10-26 DIAGNOSIS — K621 Rectal polyp: Secondary | ICD-10-CM | POA: Diagnosis not present

## 2023-11-17 DIAGNOSIS — J439 Emphysema, unspecified: Secondary | ICD-10-CM | POA: Diagnosis not present

## 2023-11-17 DIAGNOSIS — I10 Essential (primary) hypertension: Secondary | ICD-10-CM | POA: Diagnosis not present

## 2023-11-21 DIAGNOSIS — I10 Essential (primary) hypertension: Secondary | ICD-10-CM | POA: Diagnosis not present

## 2023-11-21 DIAGNOSIS — J439 Emphysema, unspecified: Secondary | ICD-10-CM | POA: Diagnosis not present

## 2023-11-21 DIAGNOSIS — E7800 Pure hypercholesterolemia, unspecified: Secondary | ICD-10-CM | POA: Diagnosis not present

## 2023-11-21 DIAGNOSIS — E669 Obesity, unspecified: Secondary | ICD-10-CM | POA: Diagnosis not present

## 2023-12-02 ENCOUNTER — Ambulatory Visit (INDEPENDENT_AMBULATORY_CARE_PROVIDER_SITE_OTHER): Admitting: Otolaryngology

## 2023-12-02 ENCOUNTER — Encounter (INDEPENDENT_AMBULATORY_CARE_PROVIDER_SITE_OTHER): Payer: Self-pay | Admitting: Otolaryngology

## 2023-12-02 VITALS — BP 133/78 | HR 89

## 2023-12-02 DIAGNOSIS — R490 Dysphonia: Secondary | ICD-10-CM | POA: Diagnosis not present

## 2023-12-02 DIAGNOSIS — K219 Gastro-esophageal reflux disease without esophagitis: Secondary | ICD-10-CM

## 2023-12-02 DIAGNOSIS — J383 Other diseases of vocal cords: Secondary | ICD-10-CM

## 2023-12-02 DIAGNOSIS — Z87891 Personal history of nicotine dependence: Secondary | ICD-10-CM | POA: Diagnosis not present

## 2023-12-02 NOTE — Progress Notes (Signed)
 Patient is having BP managed.

## 2023-12-02 NOTE — Patient Instructions (Signed)

## 2023-12-02 NOTE — Progress Notes (Signed)
 ENT CONSULT:  Reason for Consult: chronic hoarseness x 2 yrs worse recently    HPI: Discussed the use of AI scribe software for clinical note transcription with the patient, who gave verbal consent to proceed.  History of Present Illness Andrew [name] is a 68 year old male with hx of emphysema who presents with changes in his voice and hoarseness.  He has experienced hoarseness and changes in his voice for the past couple of years, with significant worsening over the last six months. His voice is described as 'very hoarse', and on some days, he 'can't say two words'. This has made holding conversations difficult, particularly with his sister in [place] .  He has a history of emphysema and is currently using Breztri and albuterol inhalers. He reports that his voice issues seemed to worsen with the use of Breztri. He quit smoking in 2020, five years ago.  There is no history of neck procedures, surgeries, or stroke. No symptoms of heartburn, reflux, or postnasal drainage.  Records Reviewed:  Seen by PCP Dr Husain 08/21/23 for emphysema and HTN  - on Breztri    Past Medical History:  Diagnosis Date   Hyperlipidemia    Hypertension     Past Surgical History:  Procedure Laterality Date   CATARACT EXTRACTION     HYDROCELE EXCISION / REPAIR     TONSILLECTOMY      Family History  Problem Relation Age of Onset   Cancer Mother    Cancer Father     Social History:  reports that he quit smoking about 5 years ago. His smoking use included cigarettes. He started smoking about 45 years ago. He has a 20 pack-year smoking history. He has never used smokeless tobacco. He reports that he does not currently use alcohol. He reports that he does not use drugs.  Allergies: No Known Allergies  Medications: I have reviewed the patient's current medications.  The PMH, PSH, Medications, Allergies, and SH were reviewed and updated.  ROS: Constitutional: Negative for fever, weight loss and  weight gain. Cardiovascular: Negative for chest pain and dyspnea on exertion. Respiratory: Is not experiencing shortness of breath at rest. Gastrointestinal: Negative for nausea and vomiting. Neurological: Negative for headaches. Psychiatric: The patient is not nervous/anxious  Blood pressure 133/78, pulse 89, SpO2 94%. There is no height or weight on file to calculate BMI.  PHYSICAL EXAM:  Exam: General: Well-developed, well-nourished Communication and Voice: breathy voice Respiratory Respiratory effort: Equal inspiration and expiration without stridor Cardiovascular Peripheral Vascular: Warm extremities with equal color/perfusion Eyes: No nystagmus with equal extraocular motion bilaterally Neuro/Psych/Balance: Patient oriented to person, place, and time; Appropriate mood and affect; Gait is intact with no imbalance; Cranial nerves I-XII are intact Head and Face Inspection: Normocephalic and atraumatic without mass or lesion Palpation: Facial skeleton intact without bony stepoffs Salivary Glands: No mass or tenderness Facial Strength: Facial motility symmetric and full bilaterally ENT Pinna: External ear intact and fully developed External canal: Canal is patent with intact skin Tympanic Membrane: Clear and mobile External Nose: No scar or anatomic deformity Internal Nose: Septum is deviated to the left. No polyp, or purulence. Mucosal edema and erythema present.  Bilateral inferior turbinate hypertrophy.  Lips, Teeth, and gums: Mucosa and teeth intact and viable TMJ: No pain to palpation with full mobility Oral cavity/oropharynx: No erythema or exudate, no lesions present Nasopharynx: No mass or lesion with intact mucosa Hypopharynx: Intact mucosa without pooling of secretions Larynx Glottic: Full true vocal cord mobility without  lesion or mass Supraglottic: Normal appearing epiglottis and AE folds severe VF atrophy glottic insufficiency  Interarytenoid Space: Moderate  pachydermia&edema Subglottic Space: Patent without lesion or edema Neck Neck and Trachea: Midline trachea without mass or lesion Thyroid : No mass or nodularity Lymphatics: No lymphadenopathy  Procedure: Preoperative diagnosis: chronic dysphonia  Postoperative diagnosis:   Same VF atrophy  Procedure: Flexible fiberoptic laryngoscopy  Surgeon: Elena Larry, MD  Anesthesia: Topical lidocaine  and Afrin Complications: None Condition is stable throughout exam  Indications and consent:  The patient presents to the clinic with above symptoms. Indirect laryngoscopy view was incomplete. Thus it was recommended that they undergo a flexible fiberoptic laryngoscopy. All of the risks, benefits, and potential complications were reviewed with the patient preoperatively and verbal informed consent was obtained.  Procedure: The patient was seated upright in the clinic. Topical lidocaine  and Afrin were applied to the nasal cavity. After adequate anesthesia had occurred, I then proceeded to pass the flexible telescope into the nasal cavity. The nasal cavity was patent without rhinorrhea or polyp. The nasopharynx was also patent without mass or lesion. The base of tongue was visualized and was normal. There were no signs of pooling of secretions in the piriform sinuses. The true vocal folds were mobile bilaterally. There were no signs of glottic or supraglottic mucosal lesion or mass. There was moderate interarytenoid pachydermia and post cricoid edema. The telescope was then slowly withdrawn and the patient tolerated the procedure throughout.    Studies Reviewed: CT chest 12/09/22 1. Interval clearing previously seen left upper and left lower infectious peribronchovascular nodularity. 2. Nodules measure 5 mm or less size, stable 08/22/2021. Low-dose CT lung cancer screening is recommended for patients who are 73-43 years of age with a 20+ pack-year history of smoking, and who are currently smoking or  quit <=15 years ago. 3. Hepatic steatosis. 4. Aortic atherosclerosis (ICD10-I70.0). Coronary artery calcification. 5.  Emphysema (ICD10-J43.9).  Assessment/Plan: Encounter Diagnoses  Name Primary?   Dysphonia Yes   Glottic insufficiency    Age-related vocal fold atrophy    Chronic GERD     Assessment and Plan Assessment & Plan Chronic dysphonia Vocal cord atrophy, severe, noted on scope exam today.   Breztri inhaler may contribute but is not primary cause. Unlikely to resolve with speech therapy. We discussed benefits of Restylane injection augmentation and he would like to proceed.  - Schedule for Restylane injection augmentation  - Discuss potential for permanent solution, such as implant, if desired after filler injection. - Advise that changing Breztri inhaler may not completely resolve issue, but can be tried if desired.  GERD LPR -  Reflux Gourmet after meals - diet and lifestyle changes to minimize GERD - Refer to [employer] blog for dietary and lifestyle modifications/reflux cook book  RTC for VF Restylane injection   Thank you for allowing me to participate in the care of this patient. Please do not hesitate to contact me with any questions or concerns.   Elena Larry, MD Otolaryngology [facility] Phone: [phone] Fax: [phone]    12/02/2023, 11:07 AM

## 2023-12-14 ENCOUNTER — Institutional Professional Consult (permissible substitution) (INDEPENDENT_AMBULATORY_CARE_PROVIDER_SITE_OTHER): Admitting: Otolaryngology

## 2023-12-17 DIAGNOSIS — J439 Emphysema, unspecified: Secondary | ICD-10-CM | POA: Diagnosis not present

## 2023-12-17 DIAGNOSIS — I10 Essential (primary) hypertension: Secondary | ICD-10-CM | POA: Diagnosis not present

## 2023-12-21 DIAGNOSIS — I10 Essential (primary) hypertension: Secondary | ICD-10-CM | POA: Diagnosis not present

## 2023-12-21 DIAGNOSIS — E669 Obesity, unspecified: Secondary | ICD-10-CM | POA: Diagnosis not present

## 2023-12-21 DIAGNOSIS — J439 Emphysema, unspecified: Secondary | ICD-10-CM | POA: Diagnosis not present

## 2023-12-21 DIAGNOSIS — E7800 Pure hypercholesterolemia, unspecified: Secondary | ICD-10-CM | POA: Diagnosis not present

## 2023-12-24 ENCOUNTER — Telehealth (INDEPENDENT_AMBULATORY_CARE_PROVIDER_SITE_OTHER): Payer: Self-pay

## 2023-12-24 ENCOUNTER — Ambulatory Visit (INDEPENDENT_AMBULATORY_CARE_PROVIDER_SITE_OTHER): Admitting: Otolaryngology

## 2023-12-24 NOTE — Telephone Encounter (Signed)
 LVM for patient to give us  a call back regarding a question he had.

## 2023-12-24 NOTE — Telephone Encounter (Signed)
 Patient called concerned about getting a prescription for Valium according to his AVS. Please advise.

## 2023-12-27 ENCOUNTER — Encounter (INDEPENDENT_AMBULATORY_CARE_PROVIDER_SITE_OTHER): Payer: Self-pay

## 2023-12-29 ENCOUNTER — Ambulatory Visit (INDEPENDENT_AMBULATORY_CARE_PROVIDER_SITE_OTHER): Admitting: Otolaryngology

## 2023-12-29 ENCOUNTER — Encounter (INDEPENDENT_AMBULATORY_CARE_PROVIDER_SITE_OTHER): Payer: Self-pay | Admitting: Otolaryngology

## 2023-12-29 VITALS — BP 124/73 | HR 93

## 2023-12-29 DIAGNOSIS — R490 Dysphonia: Secondary | ICD-10-CM

## 2023-12-29 DIAGNOSIS — J383 Other diseases of vocal cords: Secondary | ICD-10-CM

## 2023-12-29 NOTE — Progress Notes (Signed)
 Therapeutic Vocal Cord Injection CPT 68425-49 ATTENDING: Elena Larry, MD   PREOPERATIVE DIAGNOSIS(ES):  1. Vocal fold atrophy, bilateral, severe 2. Hoarseness 3. Glottic insufficiency  POSTOPERATIVE DIAGNOSIS(ES): Same  PROCEDURE PERFORMED: Laryngoscopy with restylane injection into the bilateral lateral thyroarytenoid muscle(s)  INDICATIONS FOR PROCEDURE: The risks and benefits of the surgical procedure have been explained in detail to the patient and they have elected to proceed.  CONSENT:  Informed consent was obtained prior to the procedure after discussion of risks, benefits, and alternatives and expected outcomes were discussed with the patient; consent placed in chart. The possibilities of reaction to medication, pulmonary aspiration, bleeding, infection, the need for additional procedures, failure to diagnose a condition, and creating a complication requiring transfusion or operation were discussed with the patient. The patient concurred with the proposed plan, giving informed consent.    UNIVERSAL PROTOCOL/ TIMEOUT: Preprocedure verification is complete- patient verified and consents confirmed.  ANESTHESIA: local anesthesia H&P REVIEW: The patient's history and physical were reviewed today prior to procedure. All medications were reviewed and updated as well.  PROCEDURE DETAILS:  The patient was brought to the clinic and placed in a seated position.  The anterior neck skin was then cleansed with alcohol and 1% Lidocaine  was used to infiltrate the skin overlying the thyrohyoid membrane. Patient had a NEB with 4% lidocaine  prior the start of the procedure, to ensure good anesthesia and procedure tolerance. Afrin/Lidocaine  mixture was then used to anesthetize the nasal passages. The 1.5-inch 25-gauge needle was bent with two gentle curves in same direction.  The flexible laryngoscope was then passed through the patient's nasal passageway and advanced into the larynx. The nasopharynx  was free of any lesions. The scope was passed behind the soft palate and directed toward the base of tongue. The supraglottic structures were normal in appearance. The true folds show atrophy and left vocal fold paralysis. The 23-gauge needle was passed through the petiole and 4% lidocaine  was dripped on the cords while the patient phonated. It was then attached to a luer-lock syringe filled with the filler and advanced into the vocal fold, and injection was performed into the bilateral thyroarytenoid muscle(s) at a site just anterior to the vocal process. A second injection was performed at mid-fold. The augmentation carried out until some overmedialization was noted. This was then repeated on the contralateral side. The needle was then removed from the vocal fold and the airway. The scope was removed from the nasal cavity. This completed the procedure. The patient tolerated the procedure well. IMPLANTS: Restylane-L Hyaluronic Acid  ESTIMATED BLOOD LOSS: None  SPECIMEN(S) REMOVED: None  DISPOSITION OF SPECIMEN(S): NA.  FINDINGS:  No evidence of a hematoma and the airway remained patent  CONDITION: Stable  COMPLICATIONS:The patient tolerated the procedure well without apparent complications and was ambulatory.  NOTES: will likely require repeat injection on the left side 2/2 severe atrophy, scoped through left side   PLAN: RTC 3 weeks for repeat videostrobe

## 2024-01-05 ENCOUNTER — Encounter: Payer: Self-pay | Admitting: Acute Care

## 2024-01-16 DIAGNOSIS — J439 Emphysema, unspecified: Secondary | ICD-10-CM | POA: Diagnosis not present

## 2024-01-16 DIAGNOSIS — I10 Essential (primary) hypertension: Secondary | ICD-10-CM | POA: Diagnosis not present

## 2024-01-18 ENCOUNTER — Ambulatory Visit: Admitting: Pulmonary Disease

## 2024-01-18 ENCOUNTER — Encounter: Payer: Self-pay | Admitting: Pulmonary Disease

## 2024-01-18 VITALS — BP 144/73 | HR 86 | Ht 71.0 in | Wt 238.4 lb

## 2024-01-18 DIAGNOSIS — R9389 Abnormal findings on diagnostic imaging of other specified body structures: Secondary | ICD-10-CM

## 2024-01-18 MED ORDER — SPACER/AERO-HOLDING CHAMBERS DEVI: 0 refills | Status: AC

## 2024-01-18 NOTE — Patient Instructions (Signed)
 Order to DME for spacer device for inhaler  Repeat CT scan of the chest before next visit in 6 months  Continue Breztri  Use albuterol as needed  Continue graded activities as tolerated  Call us  with significant concerns

## 2024-01-18 NOTE — Progress Notes (Signed)
 Andrew [name]    [id number]    20-May-1955  Primary Care Physician:Husain, Ardell, MD  Referring Physician: Ransom Ardell, MD [address] [place],  [place] [address]  Chief complaint:   Patient being seen for shortness of breath on exertion, abnormal CT scan of the chest  HPI:  Goes to the gym about 3 days a week at least Breathing is overall better  Compliant with [facility]  Functioning relatively well  Has been having some issues with dysphonia for which he is seeing ENT There was concern that it may be related to his inhaler  Did use Anoro in the past that was not as effective as Clinical Cytogeneticist - We discussed using a spacer device with Breztri to see if this helps the dysphonia  Denies any chest pains or chest discomfort  Quit smoking 5 years ago  No pertinent occupational history  History of hypertension-well-controlled  Outpatient Encounter Medications as of 01/18/2024  Medication Sig   albuterol (VENTOLIN HFA) 108 (90 Base) MCG/ACT inhaler SMARTSIG:2 Puff(s) By Mouth Every 4 Hours PRN   amLODipine-benazepril (LOTREL) 5-10 MG capsule 1 tablet   atorvastatin (LIPITOR) 10 MG tablet Take 10 mg by mouth daily.   BREZTRI AEROSPHERE 160-9-4.8 MCG/ACT AERO SMARTSIG:2 Puff(s) By Mouth Twice Daily   ibuprofen (ADVIL) 200 MG tablet 2 tablets with food or milk as needed   Multiple Vitamin (MULTI VITAMIN) TABS 1 tablet   zolpidem (AMBIEN) 5 MG tablet Take 5 mg by mouth at bedtime as needed for sleep.   No facility-administered encounter medications on file as of 01/18/2024.    Allergies as of 01/18/2024   (No Known Allergies)    Past Medical History:  Diagnosis Date   Hyperlipidemia    Hypertension     Past Surgical History:  Procedure Laterality Date   CATARACT EXTRACTION     HYDROCELE EXCISION / REPAIR     TONSILLECTOMY      Family History  Problem Relation Age of Onset   Cancer Mother    Cancer Father     Social History    Socioeconomic History   Marital status: Divorced    Spouse name: Not on file   Number of children: 1   Years of education: 12   Highest education level: Associate degree: occupational, scientist, product/process development, or vocational program  Occupational History   Not on file  Tobacco Use   Smoking status: Former    Current packs/day: 0.00    Average packs/day: 0.5 packs/day for 40.0 years (20.0 ttl pk-yrs)    Types: Cigarettes    Start date: 03/23/1978    Quit date: 03/23/2018    Years since quitting: 5.8   Smokeless tobacco: Never  Vaping Use   Vaping status: Never Used  Substance and Sexual Activity   Alcohol use: Not Currently    Comment: stopped 4 years ago   Drug use: No   Sexual activity: Not on file  Other Topics Concern   Not on file  Social History Narrative   Like to read, listen to podcasts, watch movies, walk dog      Good support system   Social Drivers of Health   Financial Resource Strain: Not on file  Food Insecurity: Not on file  Transportation Needs: Not on file  Physical Activity: Not on file  Stress: Not on file  Social Connections: Not on file  Intimate Partner Violence: Not on file    Review of Systems  Constitutional:  Negative for fatigue.  Respiratory:  Positive for shortness of breath. Negative for cough and chest tightness.   Psychiatric/Behavioral:  Negative for sleep disturbance.     Vitals:   01/18/24 0953  BP: (!) 144/73  Pulse: 86  SpO2: 94%     Physical Exam Constitutional:      Appearance: Normal appearance. He is obese.  HENT:     Head: Normocephalic.     Mouth/Throat:     Mouth: Mucous membranes are moist.  Eyes:     Pupils: Pupils are equal, round, and reactive to light.  Cardiovascular:     Rate and Rhythm: Normal rate and regular rhythm.     Heart sounds: No murmur heard.    No friction rub.  Pulmonary:     Effort: No respiratory distress.     Breath sounds: No stridor. No rhonchi.  Musculoskeletal:     Cervical back: No  rigidity or tenderness.  Neurological:     Mental Status: He is alert.  Psychiatric:        Mood and Affect: Mood normal.    Data Reviewed:  CT chest 12/09/2022 reviewed showing stable lung nodules Evidence of emphysema -CT was compared with previous study by myself  PFT with FEV1 of 49% with significant bronchodilator response  Assessment:  Chronic obstructive pulmonary disease - Stable with Breztri - Having some dysphonia - We will try use of Breztri with a spacer device  Shortness of breath - This appears stable - Exercising regularly is definitely helping  Multiple lung nodules - Will order for a repeat CT to be performed before next visit  Reformed smoker  Plan/Recommendations: Continue Multimedia Programmer for CT scan of the chest before next visit in 6 months  Encouraged to call with significant concerns  Trial of Breztri inhaler use with a spacer device  Follow-up in about 6 months   Encouraged to call with significant concerns  I spent 32 minutes Pre-charting, reviewing records, interviewing/examining patient, and managing orders. Jennet Epley MD [facility] 01/18/2024, 10:00 AM  CC: Ransom Other, MD

## 2024-01-19 ENCOUNTER — Ambulatory Visit (INDEPENDENT_AMBULATORY_CARE_PROVIDER_SITE_OTHER)

## 2024-01-19 ENCOUNTER — Encounter (INDEPENDENT_AMBULATORY_CARE_PROVIDER_SITE_OTHER): Payer: Self-pay

## 2024-01-19 VITALS — BP 155/75 | HR 86 | Temp 98.8°F | Ht 71.0 in | Wt 230.0 lb

## 2024-01-19 DIAGNOSIS — R490 Dysphonia: Secondary | ICD-10-CM | POA: Diagnosis not present

## 2024-01-19 DIAGNOSIS — J383 Other diseases of vocal cords: Secondary | ICD-10-CM | POA: Diagnosis not present

## 2024-01-19 DIAGNOSIS — B3789 Other sites of candidiasis: Secondary | ICD-10-CM

## 2024-01-19 MED ORDER — FLUCONAZOLE 100 MG PO TABS: 100.0000 mg | ORAL_TABLET | Freq: Every day | ORAL | 0 refills | Status: AC

## 2024-01-19 NOTE — Progress Notes (Signed)
 Dear Dr. Ransom, Here is my assessment for our mutual patient, Prentis Langdon. Thank you for allowing me the opportunity to care for your patient. Please do not hesitate to contact me should you have any other questions. Sincerely, Dr. Hadassah Parody  Otolaryngology Clinic Note Referring provider: Dr. Ransom HPI:   Initial HPI (01/19/2024) Discussed the use of AI scribe software for clinical note transcription with the patient, who gave verbal consent to proceed.  History of Present Illness Emma Schupp Isham Zell is a 68 year old male with a history of bilateral vocal fold atrophy who presents for f/u with breathy voice and difficulty speaking.  Prior pt of Dr. Soldatova. When he was last seen he underwent Restalyne injection to bilateral vocal cords.   Dysphonia and speech difficulty - Voice improved for approximately one week following an injection, but breathiness has since recurred. Still not as bad as it was initially. He is bothered by it but states he does not want another injection and not sure if he wants a more permanent solution.  - No difficulty swallowing - No issues with eating or drinking  Inhaled medication use - Uses Breztri inhaler for maintenance therapy - Uses a second inhaler for relief prior to gym activities - Utilizes a spacer to prevent steroid build-up - History of steroid-containing inhaler use    Independent Review of Additional Tests or Records:  Notes by Elena Larry reviewed (12/02/23): bilateral VF atrophy, recommended Restylane injection  Procedure note Liuba Soldatova (12/29/23): bilateral Restylane injection    PMH/Meds/All/SocHx/FamHx/ROS:   Past Medical History:  Diagnosis Date   Hyperlipidemia    Hypertension      Past Surgical History:  Procedure Laterality Date   CATARACT EXTRACTION     HYDROCELE EXCISION / REPAIR     TONSILLECTOMY      Family History  Problem Relation Age of Onset   Cancer Mother    Cancer Father       Social Connections: Not on file     Current Outpatient Medications  Medication Instructions   albuterol (VENTOLIN HFA) 108 (90 Base) MCG/ACT inhaler SMARTSIG:2 Puff(s) By Mouth Every 4 Hours PRN   amLODipine-benazepril (LOTREL) 5-10 MG capsule 1 tablet   atorvastatin (LIPITOR) 10 mg, Daily   BREZTRI AEROSPHERE 160-9-4.8 MCG/ACT AERO SMARTSIG:2 Puff(s) By Mouth Twice Daily   fluconazole (DIFLUCAN) 100 mg, Oral, Daily   ibuprofen (ADVIL) 200 MG tablet 2 tablets with food or milk as needed   Multiple Vitamin (MULTI VITAMIN) TABS 1 tablet   Spacer/Aero-Holding Chambers DEVI Use as directed   zolpidem (AMBIEN) 5 mg, At bedtime PRN     Physical Exam:   BP (!) 155/75 (BP Location: Right Arm, Patient Position: Sitting, Cuff Size: Normal)   Pulse 86   Temp 98.8 F (37.1 C)   Ht 5' 11 (1.803 m)   Wt 230 lb (104.3 kg)   SpO2 95%   BMI 32.08 kg/m   Salient findings:  CN II-XII intact  Bilateral EAC clear and TM intact with well pneumatized middle ear spaces No lesions of oral cavity/oropharynx No obviously palpable neck masses/lymphadenopathy/thyromegaly No respiratory distress or stridor; breathy and dysphonic voice Stroboscopy was indicated to better evaluate the vocal cords given the patient's history and exam findings, and is detailed below. Breathy dysphonia  Seprately Identifiable Procedures:  Prior to initiating any procedures, risks/benefits/alternatives were explained to the patient and verbal consent obtained.  Procedure Note (01/19/2024) Pre-procedure diagnosis:  Dysphonia Post-procedure diagnosis: Same Procedure: Transnasal Fiberoptic Laryngoscopy, CPT  68424 - Mod 25 Indication: dysphonia Complications: None apparent EBL: 0 mL  The procedure was undertaken to further evaluate the patient's complaint of dysphonia, with mirror exam inadequate for appropriate examination due to gag reflex and poor patient tolerance  Procedure:  Patient was identified as correct  patient. Verbal consent was obtained. The nose was sprayed with oxymetazoline and 4% lidocaine . The The flexible laryngoscope was passed through the nose to view the nasal cavity, pharynx (oropharynx, hypopharynx) and larynx.  The larynx was examined at rest and during multiple phonatory tasks. Documentation was obtained and reviewed with patient. The scope was removed. The patient tolerated the procedure well.  Findings: The nasal cavity and nasopharynx did not reveal any masses or lesions, mucosa appeared to be without obvious lesions. The tongue base, pharyngeal walls, piriform sinuses, vallecula, epiglottis and postcricoid region are normal in appearance EXCEPT: age related vocal fold atrophy with glottic gap, evidence of candidiasis . The visualized portion of the subglottis and proximal trachea is widely patent. The vocal folds are mobile bilaterally. There are no lesions on the free edge of the vocal folds nor elsewhere in the larynx worrisome for malignancy.    Electronically signed by: Hadassah JAYSON Parody, MD 01/23/2024 6:04 PM    Electronically signed by: Hadassah JAYSON Parody, MD 01/23/2024 6:04 PM   Impression & Plans:  Garlen Reinig is a 68 y.o. male with   1. Age-related vocal fold atrophy   2. 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 Previous Restylane injection provided temporary relief. Discussed referral for more permanent solutions if interested, but he prefers to wait. - Consider referral to [facility] if symptoms worsen. - He will call if he decides to pursue referral.  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 - Use spacer with inhaler to prevent recurrence.    See below regarding exact medications prescribed this encounter including dosages and route: Meds ordered  this encounter  Medications   fluconazole (DIFLUCAN) 100 MG tablet    Sig: Take 1 tablet (100 mg total) by mouth daily for 14 days.    Dispense:  14 tablet    Refill:  0      Thank you for allowing me the opportunity to care for your patient. Please do not hesitate to contact me should you have any other questions.  Sincerely, Hadassah Parody, MD Otolaryngologist (ENT), [facility] Phone: [phone] Fax: [phone]

## 2024-01-21 DIAGNOSIS — E7800 Pure hypercholesterolemia, unspecified: Secondary | ICD-10-CM | POA: Diagnosis not present

## 2024-01-21 DIAGNOSIS — J439 Emphysema, unspecified: Secondary | ICD-10-CM | POA: Diagnosis not present

## 2024-01-21 DIAGNOSIS — I10 Essential (primary) hypertension: Secondary | ICD-10-CM | POA: Diagnosis not present

## 2024-01-21 DIAGNOSIS — E669 Obesity, unspecified: Secondary | ICD-10-CM | POA: Diagnosis not present

## 2024-01-24 ENCOUNTER — Ambulatory Visit (INDEPENDENT_AMBULATORY_CARE_PROVIDER_SITE_OTHER): Admitting: Otolaryngology

## 2024-02-15 DIAGNOSIS — I10 Essential (primary) hypertension: Secondary | ICD-10-CM | POA: Diagnosis not present

## 2024-02-15 DIAGNOSIS — J439 Emphysema, unspecified: Secondary | ICD-10-CM | POA: Diagnosis not present

## 2024-03-30 NOTE — Progress Notes (Signed)
 Andrew [name]                                          MRN: [id number]   03/30/2024   The [facility] Quality Team Specialist reviewed this patient medical record for the purposes of chart review for care gap closure. The following were reviewed: chart review for care gap closure-controlling blood pressure.    [facility] Quality Team
